# Patient Record
Sex: Female | Born: 1960
Health system: Southern US, Community
[De-identification: ages and names within clinical notes are randomized; demographics above are authoritative.]

## PROBLEM LIST (undated history)

## (undated) DIAGNOSIS — J45909 Unspecified asthma, uncomplicated: Secondary | ICD-10-CM

## (undated) DIAGNOSIS — E785 Hyperlipidemia, unspecified: Secondary | ICD-10-CM

## (undated) DIAGNOSIS — C801 Malignant (primary) neoplasm, unspecified: Secondary | ICD-10-CM

## (undated) DIAGNOSIS — T7840XA Allergy, unspecified, initial encounter: Secondary | ICD-10-CM

## (undated) HISTORY — DX: Unspecified asthma, uncomplicated: J45.909

## (undated) HISTORY — PX: APPENDECTOMY: SHX54

## (undated) HISTORY — DX: Hyperlipidemia, unspecified: E78.5

## (undated) HISTORY — DX: Allergy, unspecified, initial encounter: T78.40XA

## (undated) HISTORY — DX: Malignant (primary) neoplasm, unspecified: C80.1

---

## 2005-08-31 ENCOUNTER — Encounter: Admission: RE | Admit: 2005-08-31 | Discharge: 2005-08-31 | Payer: Self-pay | Admitting: Family Medicine

## 2007-12-19 ENCOUNTER — Encounter: Admission: RE | Admit: 2007-12-19 | Discharge: 2007-12-19 | Payer: Self-pay | Admitting: Dermatology

## 2008-02-15 ENCOUNTER — Encounter: Admission: RE | Admit: 2008-02-15 | Discharge: 2008-02-15 | Payer: Self-pay | Admitting: Family Medicine

## 2011-07-06 ENCOUNTER — Other Ambulatory Visit: Payer: Self-pay | Admitting: Family Medicine

## 2011-07-06 DIAGNOSIS — Z1231 Encounter for screening mammogram for malignant neoplasm of breast: Secondary | ICD-10-CM

## 2011-08-05 ENCOUNTER — Ambulatory Visit
Admission: RE | Admit: 2011-08-05 | Discharge: 2011-08-05 | Disposition: A | Discharge status: Home / Self Care | Payer: BC Managed Care – PPO | Source: Ambulatory Visit | Attending: Family Medicine | Admitting: Family Medicine

## 2011-08-05 DIAGNOSIS — Z1231 Encounter for screening mammogram for malignant neoplasm of breast: Secondary | ICD-10-CM

## 2015-06-06 ENCOUNTER — Encounter: Payer: Self-pay | Admitting: Family Medicine

## 2015-06-06 DIAGNOSIS — E785 Hyperlipidemia, unspecified: Secondary | ICD-10-CM | POA: Insufficient documentation

## 2015-06-06 DIAGNOSIS — Z85828 Personal history of other malignant neoplasm of skin: Secondary | ICD-10-CM | POA: Insufficient documentation

## 2015-06-06 DIAGNOSIS — J45909 Unspecified asthma, uncomplicated: Secondary | ICD-10-CM | POA: Insufficient documentation

## 2015-06-06 DIAGNOSIS — T7840XA Allergy, unspecified, initial encounter: Secondary | ICD-10-CM | POA: Insufficient documentation

## 2015-06-25 ENCOUNTER — Encounter: Payer: Self-pay | Admitting: Family Medicine

## 2015-06-25 ENCOUNTER — Ambulatory Visit (INDEPENDENT_AMBULATORY_CARE_PROVIDER_SITE_OTHER): Payer: 59 | Admitting: Family Medicine

## 2015-06-25 VITALS — BP 120/64 | HR 68 | Temp 97.9°F | Resp 12 | Ht 65.5 in | Wt 163.0 lb

## 2015-06-25 DIAGNOSIS — E785 Hyperlipidemia, unspecified: Secondary | ICD-10-CM

## 2015-06-25 DIAGNOSIS — J4520 Mild intermittent asthma, uncomplicated: Secondary | ICD-10-CM

## 2015-06-25 DIAGNOSIS — Z85828 Personal history of other malignant neoplasm of skin: Secondary | ICD-10-CM

## 2015-06-25 DIAGNOSIS — Z0000 Encounter for general adult medical examination without abnormal findings: Secondary | ICD-10-CM | POA: Diagnosis not present

## 2015-06-25 DIAGNOSIS — Z1239 Encounter for other screening for malignant neoplasm of breast: Secondary | ICD-10-CM | POA: Diagnosis not present

## 2015-06-25 LAB — CBC WITH DIFFERENTIAL/PLATELET
Basophils Absolute: 0.1 10*3/uL (ref 0.0–0.1)
Basophils Relative: 1 % (ref 0–1)
Eosinophils Absolute: 0.2 10*3/uL (ref 0.0–0.7)
Eosinophils Relative: 4 % (ref 0–5)
HEMATOCRIT: 44 % (ref 36.0–46.0)
HEMOGLOBIN: 15 g/dL (ref 12.0–15.0)
LYMPHS ABS: 1.4 10*3/uL (ref 0.7–4.0)
LYMPHS PCT: 26 % (ref 12–46)
MCH: 29.7 pg (ref 26.0–34.0)
MCHC: 34.1 g/dL (ref 30.0–36.0)
MCV: 87.1 fL (ref 78.0–100.0)
MONOS PCT: 10 % (ref 3–12)
MPV: 10.4 fL (ref 8.6–12.4)
Monocytes Absolute: 0.5 10*3/uL (ref 0.1–1.0)
NEUTROS ABS: 3.1 10*3/uL (ref 1.7–7.7)
NEUTROS PCT: 59 % (ref 43–77)
Platelets: 230 10*3/uL (ref 150–400)
RBC: 5.05 MIL/uL (ref 3.87–5.11)
RDW: 12.9 % (ref 11.5–15.5)
WBC: 5.2 10*3/uL (ref 4.0–10.5)

## 2015-06-25 LAB — COMPREHENSIVE METABOLIC PANEL
ALBUMIN: 4.3 g/dL (ref 3.6–5.1)
ALK PHOS: 95 U/L (ref 33–130)
ALT: 12 U/L (ref 6–29)
AST: 17 U/L (ref 10–35)
BUN: 17 mg/dL (ref 7–25)
CHLORIDE: 101 mmol/L (ref 98–110)
CO2: 27 mmol/L (ref 20–31)
Calcium: 9.2 mg/dL (ref 8.6–10.4)
Creat: 0.85 mg/dL (ref 0.50–1.05)
Glucose, Bld: 85 mg/dL (ref 70–99)
POTASSIUM: 4.4 mmol/L (ref 3.5–5.3)
Sodium: 136 mmol/L (ref 135–146)
TOTAL PROTEIN: 7 g/dL (ref 6.1–8.1)
Total Bilirubin: 0.8 mg/dL (ref 0.2–1.2)

## 2015-06-25 LAB — LIPID PANEL
CHOL/HDL RATIO: 6.1 ratio — AB (ref <=5.0)
CHOLESTEROL: 276 mg/dL — AB (ref 125–200)
HDL: 45 mg/dL — ABNORMAL LOW (ref >=46)
LDL Cholesterol: 181 mg/dL — ABNORMAL HIGH (ref <130)
TRIGLYCERIDES: 250 mg/dL — AB (ref <150)
VLDL: 50 mg/dL — AB (ref <30)

## 2015-06-25 LAB — TSH: TSH: 2.684 u[IU]/mL (ref 0.350–4.500)

## 2015-06-25 NOTE — Patient Instructions (Addendum)
Release of records - Dr. Jan Fireman -- [place] of records- [facility] Schedule Mammogram Stool cards for colon cancer screening We will call for lab results Referral Dermatology  F/U schedule a PAP Smear within the next 2 months

## 2015-06-25 NOTE — Progress Notes (Signed)
Patient ID: Paula Beasley, female   DOB: 09/23/1960, 54 y.o.   MRN: [id number]   Subjective:    Patient ID: Paula Beasley, female    DOB: 1961/06/14, 54 y.o.   MRN: [id number]  Patient presents for [facility] and Referral  issue here to establish care for new patient physical. She is presented being followed by Dr. Jabier Mutton in [place]. Her husband is a Theme park manager and they move around quite a lot so she is only with the doctor typically 2-3 years. When she was last here in in [place] she was seen [facility]. This  She has history of intermittent allergy and asthma she uses duo lira only when she needs to she does not feel like the albuterol helps her at all. Occasionally she will take antihistamines. She does not like taking medications on a regular basis and also avoids most of the immunizations.  She does have family history of breast cancer in her mother and she is overdue for mammogram.  She has no family history of colon cancer and has declined getting colonoscopy in the past she wants to know if there is another alternative to the actual screening colonoscopy.  She is history of some purulent bowel problems typically she gets a nervous stomach while she is traveling so she has Lomotil on hand.  In the past she was treated with Effexor for hot flashes she is no longer on this medication.  History of squamous cell carcinoma as well as basal cell carcinoma she's had multiple lesions and needs to establish with a dermatologist here for her yearly routine checks. No moles of concern at this time    Review Of Systems:  GEN- denies fatigue, fever, weight loss,weakness, recent illness HEENT- denies eye drainage, change in vision, nasal discharge, CVS- denies chest pain, palpitations RESP- denies SOB, cough, wheeze ABD- denies N/V, change in stools, abd pain GU- denies dysuria, hematuria, dribbling, incontinence MSK- denies joint pain, muscle aches, injury Neuro-  denies headache, dizziness, syncope, seizure activity       Objective:    BP 120/64 mmHg  Pulse 68  Temp(Src) 97.9 F (36.6 C) (Oral)  Resp 12  Ht 5' 5.5" (1.664 m)  Wt 163 lb (73.936 kg)  BMI 26.70 kg/m2 GEN- NAD, alert and oriented x3 HEENT- PERRL, EOMI, non injected sclera, pink conjunctiva, MMM, oropharynx clear Neck- Supple, no thyromegaly CVS- RRR, no murmur RESP-CTAB ABD-NABS,soft,NT,ND GU- deferred Psych- normal affect and mood  EXT- No edema Pulses- Radial, DP- 2+        Assessment & Plan:      Problem List Items Addressed This Visit    Hyperlipidemia - Primary    Check lipids, family history of CAD and hyperlipidemia      Relevant Orders   Lipid panel   History of SCC (squamous cell carcinoma) of skin    Refer to dermatology, needs yearly exams, multiple SCC and BCC      Relevant Orders   Ambulatory referral to Dermatology   Asthma    Continue Dulera as needed, does not feel albuterol helps, advised in emergency or flare to come in for treatment      Relevant Medications   mometasone-formoterol (DULERA) 100-5 MCG/ACT AERO    Other Visit Diagnoses    Routine general medical examination at a health care facility        CPE done, stool cards for colon screening, pt to schedule mammogram, obtain records for TDAP, declines flu shot\ Deferred PAP  until next visit, at pt request     Relevant Orders    CBC with Differential/Platelet    Comprehensive metabolic panel    TSH    Breast cancer screening        Relevant Orders    MM DIGITAL SCREENING BILATERAL       Note: This dictation was prepared with Dragon dictation along with smaller phrase technology. Any transcriptional errors that result from this process are unintentional.

## 2015-06-25 NOTE — Assessment & Plan Note (Signed)
Continue Dulera as needed, does not feel albuterol helps, advised in emergency or flare to come in for treatment

## 2015-06-25 NOTE — Assessment & Plan Note (Signed)
Check lipids, family history of CAD and hyperlipidemia

## 2015-06-25 NOTE — Assessment & Plan Note (Signed)
Refer to dermatology, needs yearly exams, multiple SCC and BCC

## 2015-06-30 ENCOUNTER — Encounter: Payer: Self-pay | Admitting: *Deleted

## 2015-06-30 ENCOUNTER — Other Ambulatory Visit: Payer: Self-pay | Admitting: *Deleted

## 2015-06-30 DIAGNOSIS — E785 Hyperlipidemia, unspecified: Secondary | ICD-10-CM

## 2015-06-30 MED ORDER — ATORVASTATIN CALCIUM 20 MG PO TABS: 20.0000 mg | ORAL_TABLET | Freq: Every day | ORAL | Status: DC

## 2015-07-16 ENCOUNTER — Other Ambulatory Visit: Payer: Self-pay | Admitting: Family Medicine

## 2015-07-16 DIAGNOSIS — Z1231 Encounter for screening mammogram for malignant neoplasm of breast: Secondary | ICD-10-CM

## 2015-07-18 ENCOUNTER — Other Ambulatory Visit: Payer: 59

## 2015-07-18 DIAGNOSIS — Z1212 Encounter for screening for malignant neoplasm of rectum: Principal | ICD-10-CM

## 2015-07-18 DIAGNOSIS — Z1211 Encounter for screening for malignant neoplasm of colon: Secondary | ICD-10-CM

## 2015-07-19 LAB — FECAL OCCULT BLOOD, IMMUNOCHEMICAL
FECAL OCCULT BLOOD: NEGATIVE
FECAL OCCULT BLOOD: NEGATIVE

## 2015-07-21 ENCOUNTER — Telehealth: Payer: Self-pay | Admitting: *Deleted

## 2015-07-21 ENCOUNTER — Encounter: Payer: Self-pay | Admitting: *Deleted

## 2015-07-21 NOTE — Telephone Encounter (Signed)
Submitted referral thru [facility] on 11/101/16 to Dr. Druscilla Brownie [facility] with Referral number [id number]  Type of referral: consult and treat  Number of visits:6  Start date: 07/17/15  End date: 01/14/16  Dx: Z85.828- personal history of other malignany neoplasm of skin

## 2015-07-25 LAB — FECAL OCCULT BLOOD, IMMUNOCHEMICAL: Fecal Occult Blood: NEGATIVE

## 2015-08-04 ENCOUNTER — Other Ambulatory Visit: Payer: Self-pay | Admitting: Family Medicine

## 2015-08-04 NOTE — Telephone Encounter (Signed)
Refill appropriate and filled per protocol. 

## 2015-08-06 ENCOUNTER — Ambulatory Visit
Admission: RE | Admit: 2015-08-06 | Discharge: 2015-08-06 | Disposition: A | Discharge status: Home / Self Care | Payer: 59 | Source: Ambulatory Visit | Attending: Family Medicine | Admitting: Family Medicine

## 2015-08-06 DIAGNOSIS — Z1231 Encounter for screening mammogram for malignant neoplasm of breast: Secondary | ICD-10-CM

## 2015-08-12 ENCOUNTER — Other Ambulatory Visit: Payer: 59 | Admitting: Family Medicine

## 2015-10-02 ENCOUNTER — Other Ambulatory Visit: Payer: Self-pay | Admitting: Family Medicine

## 2015-10-02 NOTE — Telephone Encounter (Signed)
Refill appropriate and filled per protocol. 

## 2016-05-03 ENCOUNTER — Encounter: Payer: Self-pay | Admitting: Physician Assistant

## 2016-05-03 ENCOUNTER — Ambulatory Visit (INDEPENDENT_AMBULATORY_CARE_PROVIDER_SITE_OTHER): Payer: BLUE CROSS/BLUE SHIELD | Admitting: Physician Assistant

## 2016-05-03 VITALS — BP 122/84 | HR 72 | Resp 16 | Wt 160.0 lb

## 2016-05-03 DIAGNOSIS — B9689 Other specified bacterial agents as the cause of diseases classified elsewhere: Principal | ICD-10-CM

## 2016-05-03 DIAGNOSIS — J0191 Acute recurrent sinusitis, unspecified: Secondary | ICD-10-CM | POA: Diagnosis not present

## 2016-05-03 DIAGNOSIS — J988 Other specified respiratory disorders: Secondary | ICD-10-CM | POA: Diagnosis not present

## 2016-05-03 MED ORDER — AZITHROMYCIN 250 MG PO TABS: ORAL_TABLET | ORAL | 0 refills | Status: DC

## 2016-05-03 MED ORDER — PREDNISONE 20 MG PO TABS: ORAL_TABLET | ORAL | 0 refills | Status: DC

## 2016-05-03 NOTE — Progress Notes (Signed)
    Patient ID: Paula Beasley MRN: [id number], DOB: October 10, 1960, 55 y.o. Date of Encounter: 05/03/2016, 10:18 AM    Chief Complaint:  Chief Complaint  Patient presents with  . Sinus Problem    11 days     HPI: 55 y.o. year old white female presents with above.   Says that she usually has sinus infection once a year.  Says that she has been doing everything she can to treat this herself but now it has been 11 days and not improving.  Has been using Zyrtec, Mucinex and decongestant.  Feels very stopped up. Says it is all just in her head and nose and sinuses. Nothing in her chest. No sore throat or earache. No fevers or chills.     Home Meds:   Outpatient Medications Prior to Visit  Medication Sig Dispense Refill  . atorvastatin (LIPITOR) 20 MG tablet TAKE 1 TABLET BY MOUTH EVERY DAY (Patient not taking: Reported on 05/03/2016) 90 tablet 1  . diphenoxylate-atropine (LOMOTIL) 2.5-0.025 MG tablet Take 1 tablet by mouth 4 (four) times daily as needed for diarrhea or loose stools.    . mometasone-formoterol (DULERA) 100-5 MCG/ACT AERO Inhale 2 puffs into the lungs 2 (two) times daily.     No facility-administered medications prior to visit.     Allergies:  Allergies  Allergen Reactions  . Penicillins Rash      Review of Systems: See HPI for pertinent ROS. All other ROS negative.    Physical Exam: Blood pressure 122/84, pulse 72, resp. rate 16, weight 160 lb (72.6 kg)., Body mass index is 26.22 kg/m. General:  WNWD WF. Appears in no acute distress. HEENT: Normocephalic, atraumatic, eyes without discharge, sclera non-icteric, nares are without discharge. Bilateral auditory canals clear, TM's are without perforation, pearly grey and translucent with reflective cone of light bilaterally. Oral cavity moist, posterior pharynx without exudate, erythema, peritonsillar abscess. Mild tenderness with percussion to frontal and maxillary sinuses bilaterally.  Neck: Supple. No thyromegaly. No  lymphadenopathy. Lungs: Clear bilaterally to auscultation without wheezes, rales, or rhonchi. Breathing is unlabored. Heart: Regular rhythm. No murmurs, rubs, or gallops. Msk:  Strength and tone normal for age. Extremities/Skin: Warm and dry.  Neuro: Alert and oriented X 3. Moves all extremities spontaneously. Gait is normal. CNII-XII grossly in tact. Psych:  Responds to questions appropriately with a normal affect.     ASSESSMENT AND PLAN:  55 y.o. year old female with  1. Bacterial respiratory infection  She has penicillin allergy. Therefore will use azithromycin. She is to take the azithromycin and the prednisone as directed.  Can also continue over-the-counter decongestants. F/U if symptoms are not resolved within 1 week after completion of antibiotic. - azithromycin (ZITHROMAX) 250 MG tablet; Day 1: Take 2 daily. Days 2-5: Take 1 daily.  Dispense: 6 tablet; Refill: 0  2. Acute recurrent sinusitis, unspecified location - azithromycin (ZITHROMAX) 250 MG tablet; Day 1: Take 2 daily. Days 2-5: Take 1 daily.  Dispense: 6 tablet; Refill: 0 - predniSONE (DELTASONE) 20 MG tablet; Take 3 daily for 2 days, then 2 daily for 2 days, then 1 daily for 2 days.  Dispense: 12 tablet; Refill: 0   Signed, [address] [place], [place], [facility] 05/03/2016 10:18 AM

## 2016-05-17 ENCOUNTER — Encounter: Payer: Self-pay | Admitting: Family Medicine

## 2016-05-17 ENCOUNTER — Ambulatory Visit (INDEPENDENT_AMBULATORY_CARE_PROVIDER_SITE_OTHER): Payer: BLUE CROSS/BLUE SHIELD | Admitting: Family Medicine

## 2016-05-17 VITALS — BP 122/68 | HR 74 | Temp 98.9°F | Resp 14 | Ht 65.5 in | Wt 158.0 lb

## 2016-05-17 DIAGNOSIS — J0191 Acute recurrent sinusitis, unspecified: Secondary | ICD-10-CM

## 2016-05-17 DIAGNOSIS — L509 Urticaria, unspecified: Secondary | ICD-10-CM

## 2016-05-17 LAB — CBC WITH DIFFERENTIAL/PLATELET
BASOS ABS: 0 {cells}/uL (ref 0–200)
Basophils Relative: 0 %
EOS PCT: 1 %
Eosinophils Absolute: 128 cells/uL (ref 15–500)
HEMATOCRIT: 46 % — AB (ref 35.0–45.0)
HEMOGLOBIN: 16 g/dL — AB (ref 12.0–15.0)
LYMPHS PCT: 11 %
Lymphs Abs: 1408 cells/uL (ref 850–3900)
MCH: 29.9 pg (ref 27.0–33.0)
MCHC: 34.8 g/dL (ref 32.0–36.0)
MCV: 86 fL (ref 80.0–100.0)
MONO ABS: 640 {cells}/uL (ref 200–950)
MPV: 11 fL (ref 7.5–12.5)
Monocytes Relative: 5 %
NEUTROS PCT: 83 %
Neutro Abs: 10624 cells/uL — ABNORMAL HIGH (ref 1500–7800)
Platelets: 243 10*3/uL (ref 140–400)
RBC: 5.35 MIL/uL — AB (ref 3.80–5.10)
RDW: 12.8 % (ref 11.0–15.0)
WBC: 12.8 10*3/uL — AB (ref 3.8–10.8)

## 2016-05-17 LAB — COMPREHENSIVE METABOLIC PANEL
ALBUMIN: 4.2 g/dL (ref 3.6–5.1)
ALT: 13 U/L (ref 6–29)
AST: 16 U/L (ref 10–35)
Alkaline Phosphatase: 70 U/L (ref 33–130)
BILIRUBIN TOTAL: 1.7 mg/dL — AB (ref 0.2–1.2)
BUN: 18 mg/dL (ref 7–25)
CALCIUM: 9.3 mg/dL (ref 8.6–10.4)
CHLORIDE: 102 mmol/L (ref 98–110)
CO2: 23 mmol/L (ref 20–31)
Creat: 0.99 mg/dL (ref 0.50–1.05)
GLUCOSE: 136 mg/dL — AB (ref 70–99)
Potassium: 4.5 mmol/L (ref 3.5–5.3)
Sodium: 136 mmol/L (ref 135–146)
Total Protein: 6.6 g/dL (ref 6.1–8.1)

## 2016-05-17 MED ORDER — PREDNISONE 20 MG PO TABS: ORAL_TABLET | ORAL | 0 refills | Status: DC

## 2016-05-17 MED ORDER — HYDROXYZINE HCL 10 MG PO TABS: 10.0000 mg | ORAL_TABLET | Freq: Three times a day (TID) | ORAL | 0 refills | Status: DC | PRN

## 2016-05-17 MED ORDER — METHYLPREDNISOLONE ACETATE 80 MG/ML IJ SUSP
80.0000 mg | Freq: Once | INTRAMUSCULAR | Status: AC
  Administered 2016-05-17: 80 mg via INTRAMUSCULAR

## 2016-05-17 NOTE — Progress Notes (Signed)
   Subjective:    Patient ID: Paula Beasley, female    DOB: October 17, 1960, 55 y.o.   MRN: [id number]  Patient presents for Urticaria (x2 days- generalized rash to torso, arms, legs, face- slight fever)  Patient here with hives for the past 2 days. She states that she was an argument with a family member and was very on edge chapter that she has some itching on her abdomen and her upper back. Over the next 48 hours she broke out in extensive hives which is now to her facial area. She denies any difficulty breathing no cough no tongue or lip swelling. She took maximum doses of Benadryl but this does not help very much. She is not Hardin Negus that any longer with regards to the conversation she had with her family member but just does not feel right. She denies any recent illness. No new foods no new medications. With the exception of azithromycin which she took about 2 weeks ago for sinustis  but she's had this multiple times in the past that she has allergy to penicillin   Review Of Systems:  GEN- denies fatigue, fever, weight loss,weakness, recent illness HEENT- denies eye drainage, change in vision, nasal discharge, CVS- denies chest pain, palpitations RESP- denies SOB, cough, wheeze ABD- denies N/V, change in stools, abd pain GU- denies dysuria, hematuria, dribbling, incontinence MSK- denies joint pain, muscle aches, injury Neuro- denies headache, dizziness, syncope, seizure activity       Objective:    BP 122/68 (BP Location: Left Arm, Patient Position: Sitting, Cuff Size: Normal)   Pulse 74   Temp 98.9 F (37.2 C) (Oral)   Resp 14   Ht 5' 5.5" (1.664 m)   Wt 158 lb (71.7 kg)   BMI 25.89 kg/m  GEN- NAD, alert and oriented x3 HEENT- PERRL, EOMI, non injected sclera, pink conjunctiva, MMM, oropharynx clear Neck- Supple, no LAD  CVS- RRR, no murmur RESP-CTAB Skin-extensive hives (blaching circular lesions)  on trunk, arms, legs that Coalesce on chest, area above right eye  EXT- No  edema Pulses- Radial  2+        Assessment & Plan:      Problem List Items Addressed This Visit    None    Visit Diagnoses    Urticaria    -  Primary   unknown cause, ? anxiety related but quite extensive. Will treat wtih depo medrol , prednisone taper and atarax,check labs- eosinophils   Relevant Medications   methylPREDNISolone acetate (DEPO-MEDROL) injection 80 mg (Completed)   Other Relevant Orders   CBC with Differential/Platelet   Comprehensive metabolic panel   Acute recurrent sinusitis, unspecified location       Relevant Medications   diphenhydrAMINE (BENADRYL) 25 MG tablet   cetirizine (ZYRTEC) 10 MG tablet   predniSONE (DELTASONE) 20 MG tablet   methylPREDNISolone acetate (DEPO-MEDROL) injection 80 mg (Completed)      Note: This dictation was prepared with Dragon dictation along with smaller phrase technology. Any transcriptional errors that result from this process are unintentional.

## 2016-05-17 NOTE — Patient Instructions (Addendum)
F/U  If not improved  Steroid injection 48mIM  Hydroxyzine 160mthree times a day as needed   Hives Hives are itchy, red, swollen areas of the skin. They can vary in size and location on your body. Hives can come and go for hours or several days (acute hives) or for several weeks (chronic hives). Hives do not spread from person to person (noncontagious). They may get worse with scratching, exercise, and emotional stress. CAUSES   Allergic reaction to food, additives, or drugs.  Infections, including the common cold.  Illness, such as vasculitis, lupus, or thyroid disease.  Exposure to sunlight, heat, or cold.  Exercise.  Stress.  Contact with chemicals. SYMPTOMS   Red or white swollen patches on the skin. The patches may change size, shape, and location quickly and repeatedly.  Itching.  Swelling of the hands, feet, and face. This may occur if hives develop deeper in the skin. DIAGNOSIS  Your caregiver can usually tell what is wrong by performing a physical exam. Skin or blood tests may also be done to determine the cause of your hives. In some cases, the cause cannot be determined. TREATMENT  Mild cases usually get better with medicines such as antihistamines. Severe cases may require an emergency epinephrine injection. If the cause of your hives is known, treatment includes avoiding that trigger.  HOME CARE INSTRUCTIONS   Avoid causes that trigger your hives.  Take antihistamines as directed by your caregiver to reduce the severity of your hives. Non-sedating or low-sedating antihistamines are usually recommended. Do not drive while taking an antihistamine.  Take any other medicines prescribed for itching as directed by your caregiver.  Wear loose-fitting clothing.  Keep all follow-up appointments as directed by your caregiver. SEEK MEDICAL CARE IF:   You have persistent or severe itching that is not relieved with medicine.  You have painful or swollen joints. SEEK  IMMEDIATE MEDICAL CARE IF:   You have a fever.  Your tongue or lips are swollen.  You have trouble breathing or swallowing.  You feel tightness in the throat or chest.  You have abdominal pain. These problems may be the first sign of a life-threatening allergic reaction. Call your local emergency services (911 in [place] MAKE SURE YOU:   Understand these instructions.  Will watch your condition.  Will get help right away if you are not doing well or get worse.   This information is not intended to replace advice given to you by your health care provider. Make sure you discuss any questions you have with your health care provider.   Document Released: 08/23/2005 Document Revised: 08/28/2013 Document Reviewed: 11/16/2011 Elsevier Interactive Patient Education 20[employer]

## 2016-05-18 ENCOUNTER — Telehealth: Payer: Self-pay

## 2016-05-18 NOTE — Telephone Encounter (Signed)
-----   Message from Alycia Rossetti, MD sent at 05/18/2016  2:11 PM EDT ----- Call pt see how her Hives are? They did improve with steroids, atarax,any new symptoms? Her WBC were elevated showing inflammed state, still unknown cause If she is not any better, then have her come in tomorrow for a recheck

## 2016-05-18 NOTE — Telephone Encounter (Signed)
Pt states she is feeling better still has hives, but it has improved a lot.

## 2016-06-02 ENCOUNTER — Ambulatory Visit: Payer: BLUE CROSS/BLUE SHIELD | Admitting: Family Medicine

## 2016-07-23 ENCOUNTER — Other Ambulatory Visit: Payer: Self-pay | Admitting: Family Medicine

## 2016-07-23 NOTE — Telephone Encounter (Signed)
Ok to refill 

## 2016-07-23 NOTE — Telephone Encounter (Signed)
Medication called to pharmacy. 

## 2016-07-23 NOTE — Telephone Encounter (Signed)
okay

## 2017-04-12 DIAGNOSIS — H02839 Dermatochalasis of unspecified eye, unspecified eyelid: Secondary | ICD-10-CM | POA: Diagnosis not present

## 2017-04-12 DIAGNOSIS — H2513 Age-related nuclear cataract, bilateral: Secondary | ICD-10-CM | POA: Diagnosis not present

## 2017-04-12 DIAGNOSIS — H25043 Posterior subcapsular polar age-related cataract, bilateral: Secondary | ICD-10-CM | POA: Diagnosis not present

## 2017-04-12 DIAGNOSIS — H2511 Age-related nuclear cataract, right eye: Secondary | ICD-10-CM | POA: Diagnosis not present

## 2017-04-12 DIAGNOSIS — H25013 Cortical age-related cataract, bilateral: Secondary | ICD-10-CM | POA: Diagnosis not present

## 2017-05-13 ENCOUNTER — Other Ambulatory Visit: Payer: Self-pay

## 2017-05-13 ENCOUNTER — Other Ambulatory Visit: Payer: Self-pay | Admitting: Family Medicine

## 2017-05-13 MED ORDER — DIPHENOXYLATE-ATROPINE 2.5-0.025 MG PO TABS: ORAL_TABLET | ORAL | 0 refills | Status: DC

## 2017-05-23 ENCOUNTER — Telehealth: Payer: Self-pay

## 2017-05-23 MED ORDER — DIPHENOXYLATE-ATROPINE 2.5-0.025 MG PO TABS: ORAL_TABLET | ORAL | 0 refills | Status: DC

## 2017-05-23 NOTE — Telephone Encounter (Signed)
Medication called to pharmacy. 

## 2017-05-23 NOTE — Telephone Encounter (Signed)
Okay to refill Lomotil

## 2017-05-23 NOTE — Telephone Encounter (Signed)
Received VM from patient.   Requested refill on Lomotil.   Ok to refill?

## 2017-05-23 NOTE — Telephone Encounter (Signed)
Call placed to patient and patient made aware per VM.  

## 2017-05-30 DIAGNOSIS — H2511 Age-related nuclear cataract, right eye: Secondary | ICD-10-CM | POA: Diagnosis not present

## 2017-05-30 DIAGNOSIS — H52201 Unspecified astigmatism, right eye: Secondary | ICD-10-CM | POA: Diagnosis not present

## 2017-05-30 DIAGNOSIS — H2513 Age-related nuclear cataract, bilateral: Secondary | ICD-10-CM | POA: Diagnosis not present

## 2017-05-31 DIAGNOSIS — H2512 Age-related nuclear cataract, left eye: Secondary | ICD-10-CM | POA: Diagnosis not present

## 2017-08-13 DIAGNOSIS — Z961 Presence of intraocular lens: Secondary | ICD-10-CM | POA: Diagnosis not present

## 2017-08-13 DIAGNOSIS — H43811 Vitreous degeneration, right eye: Secondary | ICD-10-CM | POA: Diagnosis not present

## 2017-08-13 DIAGNOSIS — H2512 Age-related nuclear cataract, left eye: Secondary | ICD-10-CM | POA: Diagnosis not present

## 2017-09-14 ENCOUNTER — Other Ambulatory Visit: Payer: Self-pay | Admitting: Family Medicine

## 2017-09-14 DIAGNOSIS — Z139 Encounter for screening, unspecified: Secondary | ICD-10-CM

## 2017-09-21 ENCOUNTER — Encounter: Payer: Self-pay | Admitting: Family Medicine

## 2017-09-21 ENCOUNTER — Other Ambulatory Visit: Payer: Self-pay

## 2017-09-21 ENCOUNTER — Ambulatory Visit: Payer: BLUE CROSS/BLUE SHIELD | Admitting: Family Medicine

## 2017-09-21 VITALS — BP 124/64 | HR 88 | Temp 98.8°F | Resp 14 | Ht 65.5 in | Wt 156.0 lb

## 2017-09-21 DIAGNOSIS — R197 Diarrhea, unspecified: Secondary | ICD-10-CM | POA: Diagnosis not present

## 2017-09-21 DIAGNOSIS — K529 Noninfective gastroenteritis and colitis, unspecified: Secondary | ICD-10-CM

## 2017-09-21 LAB — CBC WITH DIFFERENTIAL/PLATELET
BASOS PCT: 0.6 %
Basophils Absolute: 43 cells/uL (ref 0–200)
EOS PCT: 1.8 %
Eosinophils Absolute: 128 cells/uL (ref 15–500)
HCT: 42 % (ref 35.0–45.0)
Hemoglobin: 14.7 g/dL (ref 11.7–15.5)
Lymphs Abs: 1328 cells/uL (ref 850–3900)
MCH: 29 pg (ref 27.0–33.0)
MCHC: 35 g/dL (ref 32.0–36.0)
MCV: 82.8 fL (ref 80.0–100.0)
MONOS PCT: 8.8 %
MPV: 10.5 fL (ref 7.5–12.5)
Neutro Abs: 4977 cells/uL (ref 1500–7800)
Neutrophils Relative %: 70.1 %
PLATELETS: 266 10*3/uL (ref 140–400)
RBC: 5.07 10*6/uL (ref 3.80–5.10)
RDW: 11.9 % (ref 11.0–15.0)
Total Lymphocyte: 18.7 %
WBC: 7.1 10*3/uL (ref 3.8–10.8)
WBCMIX: 625 {cells}/uL (ref 200–950)

## 2017-09-21 LAB — COMPREHENSIVE METABOLIC PANEL
AG Ratio: 1.9 (calc) (ref 1.0–2.5)
ALBUMIN MSPROF: 4.4 g/dL (ref 3.6–5.1)
ALT: 15 U/L (ref 6–29)
AST: 16 U/L (ref 10–35)
Alkaline phosphatase (APISO): 88 U/L (ref 33–130)
BUN: 22 mg/dL (ref 7–25)
CO2: 27 mmol/L (ref 20–32)
CREATININE: 0.93 mg/dL (ref 0.50–1.05)
Calcium: 9.3 mg/dL (ref 8.6–10.4)
Chloride: 104 mmol/L (ref 98–110)
GLOBULIN: 2.3 g/dL (ref 1.9–3.7)
GLUCOSE: 104 mg/dL — AB (ref 65–99)
POTASSIUM: 4.3 mmol/L (ref 3.5–5.3)
SODIUM: 138 mmol/L (ref 135–146)
TOTAL PROTEIN: 6.7 g/dL (ref 6.1–8.1)
Total Bilirubin: 0.5 mg/dL (ref 0.2–1.2)

## 2017-09-21 LAB — LIPASE: Lipase: 12 U/L (ref 7–60)

## 2017-09-21 MED ORDER — MOMETASONE FURO-FORMOTEROL FUM 100-5 MCG/ACT IN AERO: 2.0000 | INHALATION_SPRAY | Freq: Two times a day (BID) | RESPIRATORY_TRACT | 2 refills | Status: DC

## 2017-09-21 NOTE — Progress Notes (Signed)
   Subjective:    Patient ID: Paula Beasley, female    DOB: Aug 20, 1961, 57 y.o.   MRN: [id number]  Patient presents for GI Issues (x2 weeks- abd cramping, loose stools, states that imodium will relieve sx momentarily, but they come back)  Patient here with diarrhea for the past 2 weeks.  Denies any illness preceding that.  No new medications.  She has not had any change in her diet.  She does have a family history of diverticulitis.  She did start taking Imodium and the slow down her diarrhea but then she would have another loose bowel movement the next day.  She has not had any vomiting or nausea.  She is eating normally.  She just feels very fatigued and kind of worn out for the past 2 weeks as well.  No cough or congestion no fever no chills.  No known sick contacts with anyone with diarrhea.  She has not traveled outside of the country.  She has not noticed any blood in the stool.  No UTI symptoms.   Review Of Systems:  GEN- denies fatigue, fever, weight loss,weakness, recent illness HEENT- denies eye drainage, change in vision, nasal discharge, CVS- denies chest pain, palpitations RESP- denies SOB, cough, wheeze ABD- denies N/V, +change in stools, +abd pain GU- denies dysuria, hematuria, dribbling, incontinence MSK- denies joint pain, muscle aches, injury Neuro- denies headache, dizziness, syncope, seizure activity       Objective:    BP 124/64   Pulse 88   Temp 98.8 F (37.1 C) (Oral)   Resp 14   Ht 5' 5.5" (1.664 m)   Wt 156 lb (70.8 kg)   SpO2 100%   BMI 25.56 kg/m  GEN- NAD, alert and oriented x3 HEENT- PERRL, EOMI, non injected sclera, pink conjunctiva, MMM, oropharynx clear Neck- Supple, no LAD CVS- RRR, no murmur RESP-CTAB ABD-BS all 4 quadrants a little hypoactive ,soft,mild diffiuse TTP, ND EXT- No edema Pulses- Radial  2+        Assessment & Plan:      Problem List Items Addressed This Visit    None    Visit Diagnoses    Diarrhea, unspecified type     -  Primary   Stat labs normal, including lipase, no fever, no vomiting, possible gastroenteritis and as she has some IBS underlying may be prolonged, other differentials colitis or diverticulitis No bloody stools, normal CBC. Diarrhea is improved with immodium so will have her take Lomotil which she has on hand for IBS when she travels for next 2-3 days, if pain worsens or diarrhea breaks through need CT scan Did offer earlier scan but due to finances she prefers to hold off and see how she does, since vitals stable, no weight loss, I think that is reasonable   Relevant Orders   CBC with Differential/Platelet (Completed)   Comprehensive metabolic panel (Completed)   Lipase (Completed)   Gastroenteritis       Relevant Orders   CBC with Differential/Platelet (Completed)   Comprehensive metabolic panel (Completed)   Lipase (Completed)      Note: This dictation was prepared with Dragon dictation along with smaller phrase technology. Any transcriptional errors that result from this process are unintentional.

## 2017-09-21 NOTE — Patient Instructions (Signed)
F/U pending results  Take a lomotil

## 2017-09-26 ENCOUNTER — Telehealth: Payer: Self-pay | Admitting: *Deleted

## 2017-09-26 DIAGNOSIS — R1084 Generalized abdominal pain: Secondary | ICD-10-CM

## 2017-09-26 DIAGNOSIS — R197 Diarrhea, unspecified: Secondary | ICD-10-CM

## 2017-09-26 NOTE — Telephone Encounter (Signed)
Received call from patient.   Patient seen in office on 09/21/2017 D/T diarrhea.   Reports that she is not showing any improvement at this time.   MD please advise.

## 2017-09-26 NOTE — Telephone Encounter (Signed)
Recommend CT abdomen and pelvis with contrast - DX Diarrhea, abdominal pain

## 2017-09-26 NOTE — Telephone Encounter (Signed)
Call placed to patient and patient made aware.   Patient reports that she is improving slightly since her phone call this AM. Inquired as to if she can still cancel if symptoms resolve. Advised that she will be able to cancel scan is symptoms resolve.   Orders placed.

## 2017-09-29 ENCOUNTER — Encounter: Payer: Self-pay | Admitting: Family Medicine

## 2017-09-29 ENCOUNTER — Ambulatory Visit: Payer: BLUE CROSS/BLUE SHIELD | Admitting: Family Medicine

## 2017-09-29 VITALS — BP 112/76 | HR 84 | Temp 98.5°F | Resp 16 | Ht 65.0 in | Wt 152.0 lb

## 2017-09-29 DIAGNOSIS — R197 Diarrhea, unspecified: Secondary | ICD-10-CM | POA: Diagnosis not present

## 2017-09-29 DIAGNOSIS — J069 Acute upper respiratory infection, unspecified: Secondary | ICD-10-CM

## 2017-09-29 MED ORDER — ALBUTEROL SULFATE HFA 108 (90 BASE) MCG/ACT IN AERS: 2.0000 | INHALATION_SPRAY | Freq: Four times a day (QID) | RESPIRATORY_TRACT | 0 refills | Status: DC | PRN

## 2017-09-29 NOTE — Progress Notes (Signed)
Subjective:    Patient ID: Paula Beasley, female    DOB: [birth year], 59 y.o.   MRN: [id number]  HPI Patient is here today because she is concerned about diarrhea. She states that she's had diarrhea now for more than 2 weeks. She states that the diarrhea is basically liquid water that simply falls out of her numerous times a day. She can barely leave the home because of constantly running back and forth to the bathroom. She is going soft and she cannot keep track of the number. She denies any fevers or chills but she does report weight loss. She denies any hematochezia although she does occasionally have blood due to wiping. She has not traveled outside the country. She's not been hospitalized. She has not recently taken any antibiotics. She is not on any chronic medications that would cause diarrhea. She is also concerned about an upper respiratory infection that she has. It has gone on now for about a week. Symptoms include rhinorrhea, cough, head congestion, sore scratchy throat, and fatigue. On exam today she is wheezing faintly on expiration but otherwise her exam is reassuring Past Medical History:  Diagnosis Date  . Allergy   . Asthma   . Cancer ([place])    skin  . Hyperlipidemia    Past Surgical History:  Procedure Laterality Date  . APPENDECTOMY     57yo   Current Outpatient Medications on File Prior to Visit  Medication Sig Dispense Refill  . cetirizine (ZYRTEC) 10 MG tablet Take 10 mg by mouth daily.    . diphenhydrAMINE (BENADRYL) 25 MG tablet Take 25 mg by mouth every 6 (six) hours as needed.    . diphenoxylate-atropine (LOMOTIL) 2.5-0.025 MG tablet TAKE 1 TO 2 TABLETS BY MOUTH FOUR TIMES DAILY AS NEEDED. 20 tablet 0  . mometasone-formoterol (DULERA) 100-5 MCG/ACT AERO Inhale 2 puffs into the lungs 2 (two) times daily. 1 Inhaler 2   No current facility-administered medications on file prior to visit.    Allergies  Allergen Reactions  . Penicillins Rash   Social History    Socioeconomic History  . Marital status: Married    Spouse name: Not on file  . Number of children: Not on file  . Years of education: Not on file  . Highest education level: Not on file  Social Needs  . Financial resource strain: Not on file  . Food insecurity - worry: Not on file  . Food insecurity - inability: Not on file  . Transportation needs - medical: Not on file  . Transportation needs - non-medical: Not on file  Occupational History  . Not on file  Tobacco Use  . Smoking status: Never Smoker  . Smokeless tobacco: Never Used  Substance and Sexual Activity  . Alcohol use: Yes    Alcohol/week: 2.4 oz    Types: 2 Glasses of wine, 2 Cans of beer per week  . Drug use: No  . Sexual activity: Yes    Birth control/protection: Post-menopausal  Other Topics Concern  . Not on file  Social History Narrative  . Not on file      Review of Systems  All other systems reviewed and are negative.      Objective:   Physical Exam  Constitutional: She appears well-developed and well-nourished. No distress.  HENT:  Right Ear: External ear normal.  Left Ear: External ear normal.  Nose: Rhinorrhea present.  Mouth/Throat: Oropharynx is clear and moist. No oropharyngeal exudate.  Eyes: Conjunctivae are normal. No scleral  icterus.  Neck: Neck supple.  Cardiovascular: Normal rate, regular rhythm and normal heart sounds.  Pulmonary/Chest: Effort normal and breath sounds normal. No respiratory distress. She has no wheezes. She has no rales. She exhibits no tenderness.  Abdominal: Soft. Bowel sounds are normal. She exhibits no distension and no mass. There is no tenderness. There is no rebound and no guarding.  Lymphadenopathy:    She has no cervical adenopathy.  Skin: She is not diaphoretic.          Assessment & Plan:  Diarrhea, unspecified type - Plan: Gastrointestinal Pathogen Panel PCR  Viral URI  I will check a GI pathogen panel to rule out infectious sources of  diarrhea that are common such as Salmonella, Shigella, Escherichia coli, viral causes, C. difficile. If GI pathogen panel is negative and diarrhea persists, I would consider a GI referral for endoscopy to rule out inflammatory bowel disease given the severity of the diarrhea, the weight loss, etc. I explained to the patient that most likely explanation is still likely viral gastroenteritis. I have recommended a probiotic, align, 1 tablet a day over-the-counter to help facilitate resolution of the diarrhea. The remainder of her exam points to a viral upper respiratory infection with mild asthma exacerbation. This will require tincture of time and albuterol 2 puffs every 6 hours as needed.

## 2017-09-30 ENCOUNTER — Ambulatory Visit
Admission: RE | Admit: 2017-09-30 | Discharge: 2017-09-30 | Disposition: A | Discharge status: Home / Self Care | Payer: BLUE CROSS/BLUE SHIELD | Source: Ambulatory Visit | Attending: Family Medicine | Admitting: Family Medicine

## 2017-09-30 ENCOUNTER — Other Ambulatory Visit: Payer: BLUE CROSS/BLUE SHIELD

## 2017-09-30 ENCOUNTER — Telehealth: Payer: Self-pay

## 2017-09-30 DIAGNOSIS — R0689 Other abnormalities of breathing: Secondary | ICD-10-CM

## 2017-09-30 DIAGNOSIS — R05 Cough: Secondary | ICD-10-CM | POA: Diagnosis not present

## 2017-09-30 DIAGNOSIS — R197 Diarrhea, unspecified: Secondary | ICD-10-CM | POA: Diagnosis not present

## 2017-09-30 NOTE — Telephone Encounter (Signed)
Call placed to patient she is aware she need to have chest xray to rule out pneumonia.Order has been placed

## 2017-09-30 NOTE — Telephone Encounter (Signed)
Patient was in office 09/29/2017 and prescribed albuterol for upper respiratory infection. Patient states the inhaler is making her breathing worse she states she used it twice last night, she states she has taking this before with relief.  Patient states she does not want to continue to use the inhaler. Patient states she does contiune to use her [facility]. Pls advise on what you would like patient to do

## 2017-09-30 NOTE — Telephone Encounter (Signed)
Albuterol is for wheezing only.  Continue dulera.  Her exam yesterday was consistent with a viral URI and mild bronchospasm due to asthma.  If worsening, recommend CXR to rule out underlying pneumonia

## 2017-10-03 LAB — GASTROINTESTINAL PATHOGEN PANEL PCR
C. difficile Tox A/B, PCR: NOT DETECTED
CAMPYLOBACTER, PCR: NOT DETECTED
Cryptosporidium, PCR: NOT DETECTED
E COLI (STEC) STX1/STX2, PCR: NOT DETECTED
E coli (ETEC) LT/ST PCR: NOT DETECTED
E coli 0157, PCR: NOT DETECTED
Giardia lamblia, PCR: NOT DETECTED
NOROVIRUS, PCR: NOT DETECTED
ROTAVIRUS, PCR: NOT DETECTED
SALMONELLA, PCR: NOT DETECTED
Shigella, PCR: NOT DETECTED

## 2017-10-04 ENCOUNTER — Other Ambulatory Visit: Payer: Self-pay | Admitting: Family Medicine

## 2017-10-04 ENCOUNTER — Ambulatory Visit: Payer: Self-pay

## 2017-10-04 ENCOUNTER — Ambulatory Visit
Admission: RE | Admit: 2017-10-04 | Discharge: 2017-10-04 | Disposition: A | Discharge status: Home / Self Care | Payer: BLUE CROSS/BLUE SHIELD | Source: Ambulatory Visit | Attending: Family Medicine | Admitting: Family Medicine

## 2017-10-04 ENCOUNTER — Encounter: Payer: Self-pay | Admitting: Gastroenterology

## 2017-10-04 DIAGNOSIS — Z1231 Encounter for screening mammogram for malignant neoplasm of breast: Secondary | ICD-10-CM | POA: Diagnosis not present

## 2017-10-04 DIAGNOSIS — Z139 Encounter for screening, unspecified: Secondary | ICD-10-CM

## 2017-10-04 DIAGNOSIS — R197 Diarrhea, unspecified: Secondary | ICD-10-CM

## 2017-10-07 ENCOUNTER — Other Ambulatory Visit: Payer: Self-pay | Admitting: *Deleted

## 2017-10-07 NOTE — Telephone Encounter (Signed)
Received fax requesting refill on Lomotil.  Ok to refill??  Last office visit 09/29/2017.  Last refill 05/23/2017.

## 2017-10-10 MED ORDER — DIPHENOXYLATE-ATROPINE 2.5-0.025 MG PO TABS: ORAL_TABLET | ORAL | 1 refills | Status: DC

## 2017-10-21 ENCOUNTER — Ambulatory Visit: Payer: BLUE CROSS/BLUE SHIELD | Admitting: Gastroenterology

## 2017-10-21 ENCOUNTER — Other Ambulatory Visit (INDEPENDENT_AMBULATORY_CARE_PROVIDER_SITE_OTHER): Payer: BLUE CROSS/BLUE SHIELD

## 2017-10-21 ENCOUNTER — Encounter (INDEPENDENT_AMBULATORY_CARE_PROVIDER_SITE_OTHER): Payer: Self-pay

## 2017-10-21 ENCOUNTER — Encounter: Payer: Self-pay | Admitting: Gastroenterology

## 2017-10-21 VITALS — BP 106/68 | HR 70 | Ht 65.0 in | Wt 152.1 lb

## 2017-10-21 DIAGNOSIS — R197 Diarrhea, unspecified: Secondary | ICD-10-CM

## 2017-10-21 LAB — IGA: IGA: 168 mg/dL (ref 68–378)

## 2017-10-21 MED ORDER — PEG 3350-KCL-NA BICARB-NACL 420 G PO SOLR: 4000.0000 mL | Freq: Once | ORAL | 0 refills | Status: AC

## 2017-10-21 NOTE — Progress Notes (Signed)
HPI: This is a very pleasant 57 year old woman who was referred to me by Alycia Rossetti, MD  to evaluate chronic diarrhea, abdominal pain.    Chief complaint is   diarrhea, abdominal pain  Anxiety induced IBS for years: intermittent loose/constipation with abd pains.  Stress made it worse, loose.   Periodic purges of loose stools.  One occasion of noctunal loose stools.  5 weeks ago, upper abd pains, intestinal cramping, diarrhea for 1-2 weeks. Will go 6-8 loose stools.  Never bloody.    No fever or chills.  No GI issues sick contacts.  Imodium will help briefly.  Cannot point to diet changes. Cut out dairy for a few days, cut out wheat.  Has lost 6-7 poiunds overall.  No recent Abx.  Was recommended to take align and after 8-9 days of this she felt pretty normal.   Old Data Reviewed: Blood work January 2009 shows normal CBC, normal complete metabolic profile, normal lipase, GI pathogen panel was all negative.    Review of systems: Pertinent positive and negative review of systems were noted in the above HPI section. All other review negative.   Past Medical History:  Diagnosis Date  . Allergy   . Asthma   . Cancer ([place])    skin  . Hyperlipidemia     Past Surgical History:  Procedure Laterality Date  . APPENDECTOMY     57yo    Current Outpatient Medications  Medication Sig Dispense Refill  . albuterol (PROVENTIL HFA;VENTOLIN HFA) 108 (90 Base) MCG/ACT inhaler Inhale 2 puffs into the lungs every 6 (six) hours as needed for wheezing or shortness of breath. 1 Inhaler 0  . cetirizine (ZYRTEC) 10 MG tablet Take 10 mg by mouth daily as needed.     . diphenhydrAMINE (BENADRYL) 25 MG tablet Take 25 mg by mouth every 6 (six) hours as needed.    . diphenoxylate-atropine (LOMOTIL) 2.5-0.025 MG tablet TAKE 1 TO 2 TABLETS BY MOUTH FOUR TIMES DAILY AS NEEDED. 20 tablet 1  . mometasone-formoterol (DULERA) 100-5 MCG/ACT AERO Inhale 2 puffs into the lungs 2 (two) times  daily. 1 Inhaler 2   No current facility-administered medications for this visit.     Allergies as of 10/21/2017 - Review Complete 10/21/2017  Allergen Reaction Noted  . Penicillins Rash 06/06/2015    Family History  Problem Relation Age of Onset  . Hyperlipidemia Mother   . Cancer Mother        metatastic breast cancer  . Breast cancer Mother   . Asthma Father   . Hyperlipidemia Father   . Heart disease Father   . Cancer Father        Prostate  . Early death Sister        after birth   . Early death Brother        after birth     Social History   Socioeconomic History  . Marital status: Married    Spouse name: Not on file  . Number of children: 2  . Years of education: Not on file  . Highest education level: Not on file  Social Needs  . Financial resource strain: Not on file  . Food insecurity - worry: Not on file  . Food insecurity - inability: Not on file  . Transportation needs - medical: Not on file  . Transportation needs - non-medical: Not on file  Occupational History  . Occupation: Self-Empolyed  Tobacco Use  . Smoking status: Never Smoker  . Smokeless  tobacco: Never Used  Substance and Sexual Activity  . Alcohol use: Yes    Alcohol/week: 2.4 oz    Types: 2 Glasses of wine, 2 Cans of beer per week  . Drug use: No  . Sexual activity: Yes    Birth control/protection: Post-menopausal  Other Topics Concern  . Not on file  Social History Narrative  . Not on file     Physical Exam: BP 106/68   Pulse 70   Ht 5' 5"  (1.651 m)   Wt 152 lb 2 oz (69 kg)   BMI 25.31 kg/m  Constitutional: generally well-appearing Psychiatric: alert and oriented x3 Eyes: extraocular movements intact Mouth: oral pharynx moist, no lesions Neck: supple no lymphadenopathy Cardiovascular: heart regular rate and rhythm Lungs: clear to auscultation bilaterally Abdomen: soft, nontender, nondistended, no obvious ascites, no peritoneal signs, normal bowel sounds Extremities:  no lower extremity edema bilaterally Skin: no lesions on visible extremities   Assessment and plan: 57 y.o. female with diarrhea, abdominal pain  This is in the setting of chronic IBS, anxiety induced GI issues for many years.  I think there is a good chance she has postinfectious worsening of irritable bowel syndrome, diarrhea predominance.  Given that she is consistently persistently having loose stools and abdominal pains I recommended further testing with colonoscopy at her soonest convenience to check for IBD, Crohn's, ulcerative colitis, neoplasm.  We discussed potential complications including perforation, bleeding, missing a cancer and she understands and wishes to proceed.  also lab testing today to check for underlying celiac sprue.    Please see the "Patient Instructions" section for addition details about the plan.   Owens Loffler, MD [facility] 10/21/2017, 9:35 AM  Cc: Alycia Rossetti, MD

## 2017-10-21 NOTE — Patient Instructions (Addendum)
You will have labs checked today in the basement lab.  Please head down after you check out with the front desk  (celiac total IgA, tTG).  Take imodium one pill every AM shortly after waking for now.  You will be set up for a colonoscopy (for diarrhea).  Normal BMI (Body Mass Index- based on height and weight) is between 19 and 25. Your BMI today is Body mass index is 25.31 kg/m. Marland Kitchen Please consider follow up  regarding your BMI with your Primary Care Provider.

## 2017-10-24 LAB — TISSUE TRANSGLUTAMINASE, IGA: (TTG) AB, IGA: 1 U/mL

## 2017-10-31 ENCOUNTER — Encounter: Payer: Self-pay | Admitting: Gastroenterology

## 2017-10-31 DIAGNOSIS — H52202 Unspecified astigmatism, left eye: Secondary | ICD-10-CM | POA: Diagnosis not present

## 2017-10-31 DIAGNOSIS — H2512 Age-related nuclear cataract, left eye: Secondary | ICD-10-CM | POA: Diagnosis not present

## 2017-11-14 ENCOUNTER — Other Ambulatory Visit: Payer: Self-pay

## 2017-11-14 ENCOUNTER — Ambulatory Visit (AMBULATORY_SURGERY_CENTER): Payer: BLUE CROSS/BLUE SHIELD | Admitting: Gastroenterology

## 2017-11-14 ENCOUNTER — Encounter: Payer: Self-pay | Admitting: Gastroenterology

## 2017-11-14 VITALS — BP 115/73 | HR 71 | Temp 97.7°F | Resp 19 | Ht 65.0 in | Wt 152.0 lb

## 2017-11-14 DIAGNOSIS — K529 Noninfective gastroenteritis and colitis, unspecified: Secondary | ICD-10-CM | POA: Diagnosis not present

## 2017-11-14 DIAGNOSIS — K5000 Crohn's disease of small intestine without complications: Secondary | ICD-10-CM | POA: Diagnosis not present

## 2017-11-14 DIAGNOSIS — R197 Diarrhea, unspecified: Secondary | ICD-10-CM

## 2017-11-14 MED ORDER — SODIUM CHLORIDE 0.9 % IV SOLN
500.0000 mL | Freq: Once | INTRAVENOUS | Status: AC
Start: 2017-11-14 — End: ?

## 2017-11-14 NOTE — Progress Notes (Signed)
Report to PACU, RN, vss, BBS= Clear.  

## 2017-11-14 NOTE — Progress Notes (Signed)
Pt's states no medical or surgical changes since previsit or office visit. 

## 2017-11-14 NOTE — Patient Instructions (Signed)
Discharge instructions given. Biopsies taken. Handout on diverticulosis and a high fiber diet. Resume previous medications. YOU HAD AN ENDOSCOPIC PROCEDURE TODAY AT [facility]:   Refer to the procedure report that was given to you for any specific questions about what was found during the examination.  If the procedure report does not answer your questions, please call your gastroenterologist to clarify.  If you requested that your care partner not be given the details of your procedure findings, then the procedure report has been included in a sealed envelope for you to review at your convenience later.  YOU SHOULD EXPECT: Some feelings of bloating in the abdomen. Passage of more gas than usual.  Walking can help get rid of the air that was put into your GI tract during the procedure and reduce the bloating. If you had a lower endoscopy (such as a colonoscopy or flexible sigmoidoscopy) you may notice spotting of blood in your stool or on the toilet paper. If you underwent a bowel prep for your procedure, you may not have a normal bowel movement for a few days.  Please Note:  You might notice some irritation and congestion in your nose or some drainage.  This is from the oxygen used during your procedure.  There is no need for concern and it should clear up in a day or so.  SYMPTOMS TO REPORT IMMEDIATELY:   Following lower endoscopy (colonoscopy or flexible sigmoidoscopy):  Excessive amounts of blood in the stool  Significant tenderness or worsening of abdominal pains  Swelling of the abdomen that is new, acute  Fever of 100F or higher   For urgent or emergent issues, a gastroenterologist can be reached at any hour by calling [phone].   DIET:  We do recommend a small meal at first, but then you may proceed to your regular diet.  Drink plenty of fluids but you should avoid alcoholic beverages for 24 hours.  ACTIVITY:  You should plan to take it easy for the rest of  today and you should NOT DRIVE or use heavy machinery until tomorrow (because of the sedation medicines used during the test).    FOLLOW UP: Our staff will call the number listed on your records the next business day following your procedure to check on you and address any questions or concerns that you may have regarding the information given to you following your procedure. If we do not reach you, we will leave a message.  However, if you are feeling well and you are not experiencing any problems, there is no need to return our call.  We will assume that you have returned to your regular daily activities without incident.  If any biopsies were taken you will be contacted by phone or by letter within the next 1-3 weeks.  Please call us at [phone] if you have not heard about the biopsies in 3 weeks.    SIGNATURES/CONFIDENTIALITY: You and/or your care partner have signed paperwork which will be entered into your electronic medical record.  These signatures attest to the fact that that the information above on your After Visit Summary has been reviewed and is understood.  Full responsibility of the confidentiality of this discharge information lies with you and/or your care-partner.

## 2017-11-14 NOTE — Op Note (Signed)
[facility] Patient Name: Paula Beasley Procedure Date: 11/14/2017 9:00 AM MRN: [id number] Endoscopist: Milus Banister , MD Age: 57 Referring MD:  Date of Birth: 05-19-1961 Gender: Female Account #: [id number] Procedure:                Colonoscopy Indications:              Chronic diarrhea Medicines:                Monitored Anesthesia Care Procedure:                Pre-Anesthesia Assessment:                           - Prior to the procedure, a History and Physical                            was performed, and patient medications and                            allergies were reviewed. The patient's tolerance of                            previous anesthesia was also reviewed. The risks                            and benefits of the procedure and the sedation                            options and risks were discussed with the patient.                            All questions were answered, and informed consent                            was obtained. Prior Anticoagulants: The patient has                            taken no previous anticoagulant or antiplatelet                            agents. ASA Grade Assessment: II - A patient with                            mild systemic disease. After reviewing the risks                            and benefits, the patient was deemed in                            satisfactory condition to undergo the procedure.                           After obtaining informed consent, the colonoscope  was passed under direct vision. Throughout the                            procedure, the patient's blood pressure, pulse, and                            oxygen saturations were monitored continuously. The                            Model PCF-H190DL [id number]) scope was introduced                            through the anus and advanced to the the terminal                            ileum. The colonoscopy was performed  without                            difficulty. The patient tolerated the procedure                            well. The quality of the bowel preparation was                            good. The terminal ileum, ileocecal valve,                            appendiceal orifice, and rectum were photographed. Scope In: 9:03:54 AM Scope Out: 9:20:43 AM Scope Withdrawal Time: 0 hours 14 minutes 17 seconds  Total Procedure Duration: 0 hours 16 minutes 49 seconds  Findings:                 The most distal aspect of the terminal ileum was                            focally inflammed (friable, ulcerated),                            circumerentially). This was biopsied (jar 2).                           The distal ileum was normal, biopsied (jar 1).                           The colonic mucosa was normal, biopsied right (jar                            3) and left (jar 4).                           There were a few small diverticulum throughout the                            colon.  The exam was otherwise without abnormality on                            direct and retroflexion views. Complications:            No immediate complications. Estimated blood loss:                            None. Estimated Blood Loss:     Estimated blood loss: none. Impression:               - Most distal aspect of the terminal ileum was                            focally, circumferentially inflammed. IBD>neoplasm.                            Biopsies taken.                           - Remaining distal ileum and colon were normal                            (biopsied).                           - Minor diverticulosis. Recommendation:           - Patient has a contact number available for                            emergencies. The signs and symptoms of potential                            delayed complications were discussed with the                            patient. Return to normal activities  tomorrow.                            Written discharge instructions were provided to the                            patient.                           - Resume previous diet.                           - Continue present medications.                           - Repeat colonoscopy is recommended. The                            colonoscopy date will be determined after pathology  results from today's exam become available for                            review. Milus Banister, MD 11/14/2017 9:27:11 AM This report has been signed electronically.

## 2017-11-14 NOTE — Progress Notes (Signed)
Called to room to assist during endoscopic procedure.  Patient ID and intended procedure confirmed with present staff. Received instructions for my participation in the procedure from the performing physician.  

## 2017-11-15 ENCOUNTER — Telehealth: Payer: Self-pay | Admitting: *Deleted

## 2017-11-15 NOTE — Telephone Encounter (Signed)
  Follow up Call-  Call back number 11/14/2017  Post procedure Call Back phone  # [phone]  Permission to leave phone message Yes  Some recent data might be hidden     Patient questions:  Do you have a fever, pain , or abdominal swelling? No. Pain Score  0 *  Have you tolerated food without any problems? Yes.    Have you been able to return to your normal activities? Yes.    Do you have any questions about your discharge instructions: Diet   No. Medications  No. Follow up visit  No.  Do you have questions or concerns about your Care? No.  Actions: * If pain score is 4 or above: No action needed, pain <4.

## 2017-11-15 NOTE — Telephone Encounter (Signed)
No answer, message left for the patient. 

## 2017-11-21 ENCOUNTER — Other Ambulatory Visit: Payer: Self-pay

## 2017-11-21 DIAGNOSIS — K50119 Crohn's disease of large intestine with unspecified complications: Secondary | ICD-10-CM

## 2017-11-21 NOTE — Progress Notes (Signed)
You have been scheduled for a CT scan of the abdomen and pelvis at [facility] ([address]---this is in the same building as Press [facility]).   You are scheduled on 11/25/17 at 230 pm. You should arrive 15 minutes prior to your appointment time for registration. Please follow the written instructions below on the day of your exam:  WARNING: IF YOU ARE ALLERGIC TO IODINE/X-RAY DYE, PLEASE NOTIFY RADIOLOGY IMMEDIATELY AT [phone]! YOU WILL BE GIVEN A 13 HOUR PREMEDICATION PREP.  1) Do not eat or drink anything after 1030 am (4 hours prior to your test)  You may take any medications as prescribed with a small amount of water except for the following: Metformin, Glucophage, Glucovance, Avandamet, Riomet, Fortamet, Actoplus Met, Janumet, Glumetza or Metaglip. The above medications must be held the day of the exam AND 48 hours after the exam.  The purpose of you drinking the oral contrast is to aid in the visualization of your intestinal tract. The contrast solution may cause some diarrhea. Before your exam is started, you will be given a small amount of fluid to drink. Depending on your individual set of symptoms, you may also receive an intravenous injection of x-ray contrast/dye. Plan on being at [facility] for 30 minutes or longer, depending on the type of exam you are having performed.  This test typically takes 30-45 minutes to complete.  If you have any questions regarding your exam or if you need to reschedule, you may call the CT department at [phone] between the hours of 8:00 am and 5:00 pm, Monday-Friday.  ________________________________________________________________________

## 2017-11-25 ENCOUNTER — Ambulatory Visit (INDEPENDENT_AMBULATORY_CARE_PROVIDER_SITE_OTHER)
Admission: RE | Admit: 2017-11-25 | Discharge: 2017-11-25 | Disposition: A | Discharge status: Home / Self Care | Payer: BLUE CROSS/BLUE SHIELD | Source: Ambulatory Visit | Attending: Gastroenterology | Admitting: Gastroenterology

## 2017-11-25 DIAGNOSIS — K50119 Crohn's disease of large intestine with unspecified complications: Secondary | ICD-10-CM | POA: Diagnosis not present

## 2017-11-25 DIAGNOSIS — R1110 Vomiting, unspecified: Secondary | ICD-10-CM | POA: Diagnosis not present

## 2017-11-25 DIAGNOSIS — R109 Unspecified abdominal pain: Secondary | ICD-10-CM | POA: Diagnosis not present

## 2017-11-25 DIAGNOSIS — R197 Diarrhea, unspecified: Secondary | ICD-10-CM | POA: Diagnosis not present

## 2017-11-25 MED ORDER — IOPAMIDOL (ISOVUE-300) INJECTION 61%
100.0000 mL | Freq: Once | INTRAVENOUS | Status: AC | PRN
  Administered 2017-11-25: 100 mL via INTRAVENOUS

## 2017-11-29 ENCOUNTER — Ambulatory Visit (INDEPENDENT_AMBULATORY_CARE_PROVIDER_SITE_OTHER): Payer: BLUE CROSS/BLUE SHIELD | Admitting: Family Medicine

## 2017-11-29 ENCOUNTER — Encounter: Payer: Self-pay | Admitting: Family Medicine

## 2017-11-29 ENCOUNTER — Other Ambulatory Visit: Payer: Self-pay

## 2017-11-29 VITALS — BP 108/60 | HR 66 | Temp 98.5°F | Resp 14 | Ht 65.0 in | Wt 153.0 lb

## 2017-11-29 DIAGNOSIS — E782 Mixed hyperlipidemia: Secondary | ICD-10-CM

## 2017-11-29 DIAGNOSIS — Z85828 Personal history of other malignant neoplasm of skin: Secondary | ICD-10-CM | POA: Diagnosis not present

## 2017-11-29 DIAGNOSIS — Z0000 Encounter for general adult medical examination without abnormal findings: Secondary | ICD-10-CM | POA: Diagnosis not present

## 2017-11-29 LAB — CBC WITH DIFFERENTIAL/PLATELET
BASOS PCT: 0.3 %
Basophils Absolute: 18 cells/uL (ref 0–200)
EOS ABS: 142 {cells}/uL (ref 15–500)
Eosinophils Relative: 2.4 %
HEMATOCRIT: 43.7 % (ref 35.0–45.0)
Hemoglobin: 14.8 g/dL (ref 11.7–15.5)
LYMPHS ABS: 1546 {cells}/uL (ref 850–3900)
MCH: 28.6 pg (ref 27.0–33.0)
MCHC: 33.9 g/dL (ref 32.0–36.0)
MCV: 84.5 fL (ref 80.0–100.0)
MPV: 10.9 fL (ref 7.5–12.5)
Monocytes Relative: 8.8 %
NEUTROS PCT: 62.3 %
Neutro Abs: 3676 cells/uL (ref 1500–7800)
Platelets: 255 10*3/uL (ref 140–400)
RBC: 5.17 10*6/uL — AB (ref 3.80–5.10)
RDW: 12.7 % (ref 11.0–15.0)
Total Lymphocyte: 26.2 %
WBC: 5.9 10*3/uL (ref 3.8–10.8)
WBCMIX: 519 {cells}/uL (ref 200–950)

## 2017-11-29 LAB — COMPREHENSIVE METABOLIC PANEL
AG Ratio: 1.9 (calc) (ref 1.0–2.5)
ALBUMIN MSPROF: 4.5 g/dL (ref 3.6–5.1)
ALKALINE PHOSPHATASE (APISO): 81 U/L (ref 33–130)
ALT: 14 U/L (ref 6–29)
AST: 18 U/L (ref 10–35)
BILIRUBIN TOTAL: 0.8 mg/dL (ref 0.2–1.2)
BUN: 14 mg/dL (ref 7–25)
CALCIUM: 9.7 mg/dL (ref 8.6–10.4)
CHLORIDE: 104 mmol/L (ref 98–110)
CO2: 24 mmol/L (ref 20–32)
CREATININE: 0.93 mg/dL (ref 0.50–1.05)
GLOBULIN: 2.4 g/dL (ref 1.9–3.7)
Glucose, Bld: 81 mg/dL (ref 65–99)
POTASSIUM: 4.7 mmol/L (ref 3.5–5.3)
Sodium: 138 mmol/L (ref 135–146)
Total Protein: 6.9 g/dL (ref 6.1–8.1)

## 2017-11-29 LAB — LIPID PANEL
Cholesterol: 301 mg/dL — ABNORMAL HIGH (ref <200)
HDL: 55 mg/dL (ref >50)
LDL Cholesterol (Calc): 213 mg/dL (calc) — ABNORMAL HIGH
Non-HDL Cholesterol (Calc): 246 mg/dL (calc) — ABNORMAL HIGH (ref <130)
Total CHOL/HDL Ratio: 5.5 (calc) — ABNORMAL HIGH (ref <5.0)
Triglycerides: 161 mg/dL — ABNORMAL HIGH (ref <150)

## 2017-11-29 NOTE — Patient Instructions (Signed)
Referral  [facility]  F/U 1 year for Physical We will call with lab results

## 2017-11-29 NOTE — Progress Notes (Signed)
   Subjective:    Patient ID: Paula Beasley, female    DOB: 1961/05/06, 57 y.o.   MRN: [id number]  Patient presents for CPE (is fasting)  Pt here for CPE, due for fasting labs  PAP Smear Declines Mammogram- UTD   Colonoscopy UTD done 11/14/17     Dr. Tommy Rainwater- had 2 new lens for cataracts Sept 2018, Feb 2019     No concerns  She will followed by her gastroenterologist concerned that she may have inflammatory bowel disease.  She had a recent CT scan however there is no evidence of Crohn's.  She has follow-up in 2 weeks states that her bowels are near baseline   Review Of Systems:  GEN- denies fatigue, fever, weight loss,weakness, recent illness HEENT- denies eye drainage, change in vision, nasal discharge, CVS- denies chest pain, palpitations RESP- denies SOB, cough, wheeze ABD- denies N/V, change in stools, abd pain GU- denies dysuria, hematuria, dribbling, incontinence MSK- denies joint pain, muscle aches, injury Neuro- denies headache, dizziness, syncope, seizure activity       Objective:    BP 108/60   Pulse 66   Temp 98.5 F (36.9 C) (Oral)   Resp 14   Ht 5' 5"  (1.651 m)   Wt 153 lb (69.4 kg)   SpO2 99%   BMI 25.46 kg/m  GEN- NAD, alert and oriented x3 HEENT- PERRL, EOMI, non injected sclera, pink conjunctiva, MMM, oropharynx clear Neck- Supple, no thyromegaly CVS- RRR, no murmur RESP-CTAB ABD-NABS,soft,NT,ND EXT- No edema Pulses- Radial, DP- 2+        Assessment & Plan:      Problem List Items Addressed This Visit      Unprioritized   Hyperlipidemia   Relevant Orders   Lipid panel (Completed)   History of SCC (squamous cell carcinoma) of skin    Overdue for complete skin check by dermatology history of squamous cell referral placed      Relevant Orders   Ambulatory referral to Dermatology    Other Visit Diagnoses    Routine general medical examination at a health care facility    -  Primary   CPE done, declines immunizations,PAP Smear, labs  done, had HIV testing in past    Relevant Orders   CBC with Differential/Platelet (Completed)   Comprehensive metabolic panel (Completed)      Note: This dictation was prepared with Dragon dictation along with smaller phrase technology. Any transcriptional errors that result from this process are unintentional.

## 2017-11-30 ENCOUNTER — Encounter: Payer: Self-pay | Admitting: Family Medicine

## 2017-11-30 NOTE — Assessment & Plan Note (Signed)
Overdue for complete skin check by dermatology history of squamous cell referral placed

## 2017-12-01 ENCOUNTER — Other Ambulatory Visit: Payer: Self-pay | Admitting: *Deleted

## 2017-12-01 MED ORDER — ATORVASTATIN CALCIUM 20 MG PO TABS: 20.0000 mg | ORAL_TABLET | Freq: Every day | ORAL | 3 refills | Status: DC

## 2017-12-12 ENCOUNTER — Ambulatory Visit: Payer: BLUE CROSS/BLUE SHIELD | Admitting: Gastroenterology

## 2017-12-12 ENCOUNTER — Other Ambulatory Visit: Payer: BLUE CROSS/BLUE SHIELD

## 2017-12-12 ENCOUNTER — Encounter: Payer: Self-pay | Admitting: Gastroenterology

## 2017-12-12 VITALS — BP 110/60 | HR 80 | Ht 64.0 in | Wt 152.1 lb

## 2017-12-12 DIAGNOSIS — R933 Abnormal findings on diagnostic imaging of other parts of digestive tract: Secondary | ICD-10-CM | POA: Diagnosis not present

## 2017-12-12 DIAGNOSIS — K50119 Crohn's disease of large intestine with unspecified complications: Secondary | ICD-10-CM | POA: Diagnosis not present

## 2017-12-12 NOTE — Progress Notes (Signed)
Review of pertinent gastrointestinal problems: 1. Chronic Ileitis; Colonoscopy Dr. Ardis Hughs 11/2017 for loose stools found fairly focal, distal ileum inflammation.  Pathology showed "chronic active inflammation" in ileum, right colon "active inflammation" no granulomas in either areas. CT enterography 11/2017: normal except ? Multiple gastric polyps.     HPI: This is a very pleasant 57 year old woman whom I last saw the time of a colonoscopy about a month ago.  She came in today to discuss test results.  Bowels inconsistent lately;  Will still have some bad days and will take imodiums, even lomotil (twoce a week on average).  No lower abdominal pains generally.  Has had bowel issues dating back ot middle school.  Very rare NSAIDs.  Chief complaint is ileitis  ROS: complete GI ROS as described in HPI, all other review negative.  Constitutional:  No unintentional weight lost ; weight stable, using the same scale here in [facility] office.   Past Medical History:  Diagnosis Date  . Allergy   . Asthma   . Cancer ([place])    skin  . Hyperlipidemia     Past Surgical History:  Procedure Laterality Date  . APPENDECTOMY     57yo    Current Outpatient Medications  Medication Sig Dispense Refill  . albuterol (PROVENTIL HFA;VENTOLIN HFA) 108 (90 Base) MCG/ACT inhaler Inhale 2 puffs into the lungs every 6 (six) hours as needed for wheezing or shortness of breath. 1 Inhaler 0  . atorvastatin (LIPITOR) 20 MG tablet Take 1 tablet (20 mg total) by mouth daily. 90 tablet 3  . cetirizine (ZYRTEC) 10 MG tablet Take 10 mg by mouth daily as needed.     . diphenhydrAMINE (BENADRYL) 25 MG tablet Take 25 mg by mouth every 6 (six) hours as needed.    . diphenoxylate-atropine (LOMOTIL) 2.5-0.025 MG tablet TAKE 1 TO 2 TABLETS BY MOUTH FOUR TIMES DAILY AS NEEDED. 20 tablet 1  . mometasone-formoterol (DULERA) 100-5 MCG/ACT AERO Inhale 2 puffs into the lungs 2 (two) times daily. 1 Inhaler 2   Current  Facility-Administered Medications  Medication Dose Route Frequency Provider Last Rate Last Dose  . 0.9 %  sodium chloride infusion  500 mL Intravenous Once Milus Banister, MD        Allergies as of 12/12/2017 - Review Complete 12/12/2017  Allergen Reaction Noted  . Penicillins Rash 06/06/2015    Family History  Problem Relation Age of Onset  . Hyperlipidemia Mother   . Cancer Mother        metatastic breast cancer  . Breast cancer Mother   . Asthma Father   . Hyperlipidemia Father   . Heart disease Father   . Cancer Father        Prostate  . Early death Sister        after birth   . Early death Brother        after birth     Social History   Socioeconomic History  . Marital status: Married    Spouse name: Not on file  . Number of children: 2  . Years of education: Not on file  . Highest education level: Not on file  Occupational History  . Occupation: Self-Empolyed  Social Needs  . Financial resource strain: Not on file  . Food insecurity:    Worry: Not on file    Inability: Not on file  . Transportation needs:    Medical: Not on file    Non-medical: Not on file  Tobacco Use  .  Smoking status: Never Smoker  . Smokeless tobacco: Never Used  Substance and Sexual Activity  . Alcohol use: Yes    Alcohol/week: 2.4 oz    Types: 2 Glasses of wine, 2 Cans of beer per week  . Drug use: No  . Sexual activity: Yes    Birth control/protection: Post-menopausal  Lifestyle  . Physical activity:    Days per week: Not on file    Minutes per session: Not on file  . Stress: Not on file  Relationships  . Social connections:    Talks on phone: Not on file    Gets together: Not on file    Attends religious service: Not on file    Active member of club or organization: Not on file    Attends meetings of clubs or organizations: Not on file    Relationship status: Not on file  . Intimate partner violence:    Fear of current or ex partner: Not on file    Emotionally  abused: Not on file    Physically abused: Not on file    Forced sexual activity: Not on file  Other Topics Concern  . Not on file  Social History Narrative  . Not on file     Physical Exam: BP 110/60 (BP Location: Left Arm, Patient Position: Sitting, Cuff Size: Normal)   Pulse 80   Ht 5' 4"  (1.626 m) Comment: height measured without shoes  Wt 152 lb 2 oz (69 kg)   BMI 26.11 kg/m  Constitutional: generally well-appearing Psychiatric: alert and oriented x3 Abdomen: soft, nontender, nondistended, no obvious ascites, no peritoneal signs, normal bowel sounds No peripheral edema noted in lower extremities  Assessment and plan: 57 y.o. female with possible Crohn's disease, also abnormal stomach on recent imaging  I am not completely convinced that she has Crohn's disease.  I would like to do some blood work to check for underlying inflammation and also IBD first step type antibody testing.  Clinically her symptoms seem a bit more functional.  Also I cannot put this together with what CT scan suggestion her stomach which is multiple polyps.  Recommended that we definitely proceed with upper endoscopy for further information, data on the gastric polyps.  Please see the "Patient Instructions" section for addition details about the plan.  Owens Loffler, MD [facility] 12/12/2017, 3:26 PM

## 2017-12-12 NOTE — Patient Instructions (Addendum)
You will have labs checked today in the basement lab.  Please head down after you check out with the front desk  (esr, crp, IBD first step blood test). You will be set up for an upper endoscopy for abnormal CT scan of the stomach.  Normal BMI (Body Mass Index- based on height and weight) is between 19 and 25. Your BMI today is Body mass index is 26.11 kg/m. Marland Kitchen Please consider follow up  regarding your BMI with your Primary Care Provider.

## 2017-12-14 LAB — INFLAMMATORY BOWEL DISEASE-IBD
Saccharomyces cerevisiae, IgA: <20 Units (ref 0.0–24.9)
Saccharomyces cerevisiae, IgG: <20 Units (ref 0.0–24.9)

## 2017-12-26 ENCOUNTER — Telehealth: Payer: Self-pay

## 2017-12-26 NOTE — Telephone Encounter (Signed)
Dr Ardis Hughs the pt would like to speak with you sooner than procedure appt about test results.

## 2018-01-06 ENCOUNTER — Encounter: Payer: BLUE CROSS/BLUE SHIELD | Admitting: Gastroenterology

## 2018-01-16 NOTE — Telephone Encounter (Signed)
We spoke, I believe I answered all her questions.  In general I am not convinced that she has inflammatory bowel disease, Crohn's.   her symptoms are slowly improving with time.  Possibly she had some postinfectious process.  She understands and agrees to simply follow her clinically.  She is scheduled for upper endoscopy to investigate polyps that were noted by CAT scan.  That is in about 5 weeks.  She will call here sooner if she has any questions or concerns.

## 2018-02-15 ENCOUNTER — Encounter: Payer: Self-pay | Admitting: Gastroenterology

## 2018-02-24 ENCOUNTER — Encounter: Payer: BLUE CROSS/BLUE SHIELD | Admitting: Gastroenterology

## 2018-03-21 DIAGNOSIS — D225 Melanocytic nevi of trunk: Secondary | ICD-10-CM | POA: Diagnosis not present

## 2018-03-21 DIAGNOSIS — C44619 Basal cell carcinoma of skin of left upper limb, including shoulder: Secondary | ICD-10-CM | POA: Diagnosis not present

## 2018-03-21 DIAGNOSIS — L814 Other melanin hyperpigmentation: Secondary | ICD-10-CM | POA: Diagnosis not present

## 2018-03-21 DIAGNOSIS — Z85828 Personal history of other malignant neoplasm of skin: Secondary | ICD-10-CM | POA: Diagnosis not present

## 2018-03-21 DIAGNOSIS — L570 Actinic keratosis: Secondary | ICD-10-CM | POA: Diagnosis not present

## 2018-03-21 DIAGNOSIS — D485 Neoplasm of uncertain behavior of skin: Secondary | ICD-10-CM | POA: Diagnosis not present

## 2018-04-11 ENCOUNTER — Telehealth: Payer: Self-pay

## 2018-04-11 NOTE — Telephone Encounter (Signed)
Dr Jac Canavan

## 2018-04-11 NOTE — Telephone Encounter (Signed)
Per pt she wants Dr. Ardis Hughs to be aware that she has cancelled her procedure due to finances. She also states that she is feeling better, and at this time she cannot add any more bills.

## 2018-05-01 ENCOUNTER — Ambulatory Visit: Payer: BLUE CROSS/BLUE SHIELD | Admitting: Family Medicine

## 2018-05-01 ENCOUNTER — Encounter: Payer: Self-pay | Admitting: Family Medicine

## 2018-05-01 VITALS — BP 110/74 | HR 60 | Temp 98.2°F | Resp 16 | Ht 64.0 in | Wt 157.0 lb

## 2018-05-01 DIAGNOSIS — B9689 Other specified bacterial agents as the cause of diseases classified elsewhere: Secondary | ICD-10-CM | POA: Diagnosis not present

## 2018-05-01 DIAGNOSIS — J019 Acute sinusitis, unspecified: Secondary | ICD-10-CM

## 2018-05-01 MED ORDER — AZITHROMYCIN 250 MG PO TABS: ORAL_TABLET | ORAL | 0 refills | Status: DC

## 2018-05-01 MED ORDER — FLUTICASONE PROPIONATE 50 MCG/ACT NA SUSP: 2.0000 | Freq: Every day | NASAL | 11 refills | Status: DC

## 2018-05-01 NOTE — Progress Notes (Signed)
Subjective:    Patient ID: Paula Beasley, female    DOB: 05-11-1961, 57 y.o.   MRN: [id number]  HPI Patient reports a 2-week history of head congestion, rhinorrhea, head pressure.  She reports pain and pressure in her frontal and maxillary sinuses left greater than right.  She is tried Zyrtec.  She is tried Mucinex.  Unfortunately it has worsened.  She now has tenderness to percussion of her left frontal and left maxillary sinus.  She has a dull constant headache.  She denies any fevers or chills.  She denies any cough.  She reports copious rhinorrhea and postnasal drip. Past Medical History:  Diagnosis Date  . Allergy   . Asthma   . Cancer ([place])    skin  . Hyperlipidemia    Past Surgical History:  Procedure Laterality Date  . APPENDECTOMY     57yo   Current Outpatient Medications on File Prior to Visit  Medication Sig Dispense Refill  . albuterol (PROVENTIL HFA;VENTOLIN HFA) 108 (90 Base) MCG/ACT inhaler Inhale 2 puffs into the lungs every 6 (six) hours as needed for wheezing or shortness of breath. 1 Inhaler 0  . atorvastatin (LIPITOR) 20 MG tablet Take 1 tablet (20 mg total) by mouth daily. 90 tablet 3  . cetirizine (ZYRTEC) 10 MG tablet Take 10 mg by mouth daily as needed.     . diphenhydrAMINE (BENADRYL) 25 MG tablet Take 25 mg by mouth every 6 (six) hours as needed.    . diphenoxylate-atropine (LOMOTIL) 2.5-0.025 MG tablet TAKE 1 TO 2 TABLETS BY MOUTH FOUR TIMES DAILY AS NEEDED. 20 tablet 1  . mometasone-formoterol (DULERA) 100-5 MCG/ACT AERO Inhale 2 puffs into the lungs 2 (two) times daily. 1 Inhaler 2   Current Facility-Administered Medications on File Prior to Visit  Medication Dose Route Frequency Provider Last Rate Last Dose  . 0.9 %  sodium chloride infusion  500 mL Intravenous Once Milus Banister, MD       Allergies  Allergen Reactions  . Penicillins Rash   Social History   Socioeconomic History  . Marital status: Married    Spouse name: Not on file  . Number  of children: 2  . Years of education: Not on file  . Highest education level: Not on file  Occupational History  . Occupation: Self-Empolyed  Social Needs  . Financial resource strain: Not on file  . Food insecurity:    Worry: Not on file    Inability: Not on file  . Transportation needs:    Medical: Not on file    Non-medical: Not on file  Tobacco Use  . Smoking status: Never Smoker  . Smokeless tobacco: Never Used  Substance and Sexual Activity  . Alcohol use: Yes    Alcohol/week: 4.0 standard drinks    Types: 2 Glasses of wine, 2 Cans of beer per week  . Drug use: No  . Sexual activity: Yes    Birth control/protection: Post-menopausal  Lifestyle  . Physical activity:    Days per week: Not on file    Minutes per session: Not on file  . Stress: Not on file  Relationships  . Social connections:    Talks on phone: Not on file    Gets together: Not on file    Attends religious service: Not on file    Active member of club or organization: Not on file    Attends meetings of clubs or organizations: Not on file    Relationship status: Not on  file  . Intimate partner violence:    Fear of current or ex partner: Not on file    Emotionally abused: Not on file    Physically abused: Not on file    Forced sexual activity: Not on file  Other Topics Concern  . Not on file  Social History Narrative  . Not on file      Review of Systems  All other systems reviewed and are negative.      Objective:   Physical Exam  Constitutional: She appears well-developed and well-nourished. No distress.  HENT:  Right Ear: Tympanic membrane, external ear and ear canal normal.  Left Ear: Tympanic membrane, external ear and ear canal normal.  Nose: Mucosal edema and rhinorrhea present. Left sinus exhibits maxillary sinus tenderness and frontal sinus tenderness.  Mouth/Throat: Oropharynx is clear and moist. No oropharyngeal exudate.  Eyes: Conjunctivae are normal. No scleral icterus.    Neck: Neck supple.  Cardiovascular: Normal rate, regular rhythm and normal heart sounds.  Pulmonary/Chest: Effort normal and breath sounds normal. No respiratory distress. She has no wheezes. She has no rales. She exhibits no tenderness.  Lymphadenopathy:    She has no cervical adenopathy.  Skin: She is not diaphoretic.          Assessment & Plan:  Acute bacterial sinusitis - Plan: azithromycin (ZITHROMAX) 250 MG tablet  I believe the patient has a bacterial sinus infection likely triggered by allergies to ragweed that started and has subsequently become secondarily infected.  I have recommended starting Flonase 2 sprays each nostril daily as well as a Z-Pak for the sinus infection.  I have also recommended nasal saline in an effort to help flush empty the sinuses.  If symptoms worsen, consider oral steroids and/or Levaquin.

## 2018-05-03 ENCOUNTER — Encounter: Payer: BLUE CROSS/BLUE SHIELD | Admitting: Gastroenterology

## 2018-07-30 ENCOUNTER — Other Ambulatory Visit: Payer: Self-pay | Admitting: Family Medicine

## 2018-09-03 ENCOUNTER — Encounter: Payer: Self-pay | Admitting: Family Medicine

## 2018-09-04 MED ORDER — MOMETASONE FURO-FORMOTEROL FUM 100-5 MCG/ACT IN AERO: 2.0000 | INHALATION_SPRAY | Freq: Two times a day (BID) | RESPIRATORY_TRACT | 2 refills | Status: DC

## 2018-09-06 DIAGNOSIS — J019 Acute sinusitis, unspecified: Secondary | ICD-10-CM | POA: Diagnosis not present

## 2018-09-06 DIAGNOSIS — J45901 Unspecified asthma with (acute) exacerbation: Secondary | ICD-10-CM | POA: Diagnosis not present

## 2018-09-07 ENCOUNTER — Telehealth: Payer: Self-pay | Admitting: *Deleted

## 2018-09-07 NOTE — Telephone Encounter (Signed)
[facility] is reviewing your PA request. Typically an electronic response will be received within 72 hours. To check for an update later, open this request from your dashboard.    You may close this dialog and return to your dashboard to perform other tasks.

## 2018-09-07 NOTE — Telephone Encounter (Signed)
Received request from pharmacy for [facility] on [facility].   PA submitted.   Dx: [id number] [phone]

## 2018-09-18 NOTE — Telephone Encounter (Signed)
She can try Symbicort 70m 2 puff BID Or Breo 1072monce a day  Both are inhaled steroids  I AM not sure if she has tried either. If not, I would try the Symbicort first  She can come get coupon card if needed

## 2018-09-18 NOTE — Telephone Encounter (Signed)
Received PA determination.   PA denied as medication is not listed on formulary of approved medications.   Advised to discuss alternatives with provider.   MD please advise.

## 2018-09-18 NOTE — Telephone Encounter (Signed)
Patient aware per MyChart.   Will wait for patient response.

## 2018-10-11 ENCOUNTER — Encounter: Payer: Self-pay | Admitting: Family Medicine

## 2018-10-13 MED ORDER — BUDESONIDE-FORMOTEROL FUMARATE 160-4.5 MCG/ACT IN AERO: 2.0000 | INHALATION_SPRAY | Freq: Two times a day (BID) | RESPIRATORY_TRACT | 3 refills | Status: DC

## 2018-10-23 ENCOUNTER — Other Ambulatory Visit: Payer: Self-pay | Admitting: Family Medicine

## 2018-10-23 NOTE — Telephone Encounter (Signed)
Ok to refill??  Last office visit 05/01/2018.  Last refill 10/10/2017, #1 refill.

## 2018-12-03 ENCOUNTER — Other Ambulatory Visit: Payer: Self-pay | Admitting: Family Medicine

## 2019-01-11 DIAGNOSIS — M62830 Muscle spasm of back: Secondary | ICD-10-CM | POA: Diagnosis not present

## 2019-01-11 DIAGNOSIS — M542 Cervicalgia: Secondary | ICD-10-CM | POA: Diagnosis not present

## 2019-01-11 DIAGNOSIS — M9902 Segmental and somatic dysfunction of thoracic region: Secondary | ICD-10-CM | POA: Diagnosis not present

## 2019-01-11 DIAGNOSIS — M9901 Segmental and somatic dysfunction of cervical region: Secondary | ICD-10-CM | POA: Diagnosis not present

## 2019-01-18 DIAGNOSIS — M9902 Segmental and somatic dysfunction of thoracic region: Secondary | ICD-10-CM | POA: Diagnosis not present

## 2019-01-18 DIAGNOSIS — M542 Cervicalgia: Secondary | ICD-10-CM | POA: Diagnosis not present

## 2019-01-18 DIAGNOSIS — M62830 Muscle spasm of back: Secondary | ICD-10-CM | POA: Diagnosis not present

## 2019-01-18 DIAGNOSIS — M9901 Segmental and somatic dysfunction of cervical region: Secondary | ICD-10-CM | POA: Diagnosis not present

## 2019-01-22 DIAGNOSIS — M62830 Muscle spasm of back: Secondary | ICD-10-CM | POA: Diagnosis not present

## 2019-01-22 DIAGNOSIS — M9901 Segmental and somatic dysfunction of cervical region: Secondary | ICD-10-CM | POA: Diagnosis not present

## 2019-01-22 DIAGNOSIS — M9902 Segmental and somatic dysfunction of thoracic region: Secondary | ICD-10-CM | POA: Diagnosis not present

## 2019-01-22 DIAGNOSIS — M542 Cervicalgia: Secondary | ICD-10-CM | POA: Diagnosis not present

## 2019-01-24 DIAGNOSIS — M9902 Segmental and somatic dysfunction of thoracic region: Secondary | ICD-10-CM | POA: Diagnosis not present

## 2019-01-24 DIAGNOSIS — M62830 Muscle spasm of back: Secondary | ICD-10-CM | POA: Diagnosis not present

## 2019-01-24 DIAGNOSIS — M9901 Segmental and somatic dysfunction of cervical region: Secondary | ICD-10-CM | POA: Diagnosis not present

## 2019-01-24 DIAGNOSIS — M542 Cervicalgia: Secondary | ICD-10-CM | POA: Diagnosis not present

## 2019-01-25 DIAGNOSIS — M9901 Segmental and somatic dysfunction of cervical region: Secondary | ICD-10-CM | POA: Diagnosis not present

## 2019-01-25 DIAGNOSIS — M62830 Muscle spasm of back: Secondary | ICD-10-CM | POA: Diagnosis not present

## 2019-01-25 DIAGNOSIS — M9902 Segmental and somatic dysfunction of thoracic region: Secondary | ICD-10-CM | POA: Diagnosis not present

## 2019-01-25 DIAGNOSIS — M542 Cervicalgia: Secondary | ICD-10-CM | POA: Diagnosis not present

## 2019-01-30 DIAGNOSIS — M9902 Segmental and somatic dysfunction of thoracic region: Secondary | ICD-10-CM | POA: Diagnosis not present

## 2019-01-30 DIAGNOSIS — M9901 Segmental and somatic dysfunction of cervical region: Secondary | ICD-10-CM | POA: Diagnosis not present

## 2019-01-30 DIAGNOSIS — M542 Cervicalgia: Secondary | ICD-10-CM | POA: Diagnosis not present

## 2019-01-30 DIAGNOSIS — M62830 Muscle spasm of back: Secondary | ICD-10-CM | POA: Diagnosis not present

## 2019-01-31 DIAGNOSIS — M542 Cervicalgia: Secondary | ICD-10-CM | POA: Diagnosis not present

## 2019-01-31 DIAGNOSIS — M9902 Segmental and somatic dysfunction of thoracic region: Secondary | ICD-10-CM | POA: Diagnosis not present

## 2019-01-31 DIAGNOSIS — M9901 Segmental and somatic dysfunction of cervical region: Secondary | ICD-10-CM | POA: Diagnosis not present

## 2019-01-31 DIAGNOSIS — M62830 Muscle spasm of back: Secondary | ICD-10-CM | POA: Diagnosis not present

## 2019-02-01 DIAGNOSIS — M9902 Segmental and somatic dysfunction of thoracic region: Secondary | ICD-10-CM | POA: Diagnosis not present

## 2019-02-01 DIAGNOSIS — M9901 Segmental and somatic dysfunction of cervical region: Secondary | ICD-10-CM | POA: Diagnosis not present

## 2019-02-01 DIAGNOSIS — M542 Cervicalgia: Secondary | ICD-10-CM | POA: Diagnosis not present

## 2019-02-01 DIAGNOSIS — M62830 Muscle spasm of back: Secondary | ICD-10-CM | POA: Diagnosis not present

## 2019-02-05 DIAGNOSIS — M542 Cervicalgia: Secondary | ICD-10-CM | POA: Diagnosis not present

## 2019-02-05 DIAGNOSIS — M9902 Segmental and somatic dysfunction of thoracic region: Secondary | ICD-10-CM | POA: Diagnosis not present

## 2019-02-05 DIAGNOSIS — M9901 Segmental and somatic dysfunction of cervical region: Secondary | ICD-10-CM | POA: Diagnosis not present

## 2019-02-05 DIAGNOSIS — M62830 Muscle spasm of back: Secondary | ICD-10-CM | POA: Diagnosis not present

## 2019-02-07 DIAGNOSIS — M62830 Muscle spasm of back: Secondary | ICD-10-CM | POA: Diagnosis not present

## 2019-02-07 DIAGNOSIS — M9902 Segmental and somatic dysfunction of thoracic region: Secondary | ICD-10-CM | POA: Diagnosis not present

## 2019-02-07 DIAGNOSIS — M9901 Segmental and somatic dysfunction of cervical region: Secondary | ICD-10-CM | POA: Diagnosis not present

## 2019-02-07 DIAGNOSIS — M542 Cervicalgia: Secondary | ICD-10-CM | POA: Diagnosis not present

## 2019-02-08 DIAGNOSIS — M62830 Muscle spasm of back: Secondary | ICD-10-CM | POA: Diagnosis not present

## 2019-02-08 DIAGNOSIS — M542 Cervicalgia: Secondary | ICD-10-CM | POA: Diagnosis not present

## 2019-02-08 DIAGNOSIS — M9901 Segmental and somatic dysfunction of cervical region: Secondary | ICD-10-CM | POA: Diagnosis not present

## 2019-02-08 DIAGNOSIS — M9902 Segmental and somatic dysfunction of thoracic region: Secondary | ICD-10-CM | POA: Diagnosis not present

## 2019-02-12 DIAGNOSIS — M9901 Segmental and somatic dysfunction of cervical region: Secondary | ICD-10-CM | POA: Diagnosis not present

## 2019-02-12 DIAGNOSIS — M9902 Segmental and somatic dysfunction of thoracic region: Secondary | ICD-10-CM | POA: Diagnosis not present

## 2019-02-12 DIAGNOSIS — M62830 Muscle spasm of back: Secondary | ICD-10-CM | POA: Diagnosis not present

## 2019-02-12 DIAGNOSIS — M542 Cervicalgia: Secondary | ICD-10-CM | POA: Diagnosis not present

## 2019-02-14 DIAGNOSIS — M9902 Segmental and somatic dysfunction of thoracic region: Secondary | ICD-10-CM | POA: Diagnosis not present

## 2019-02-14 DIAGNOSIS — M62830 Muscle spasm of back: Secondary | ICD-10-CM | POA: Diagnosis not present

## 2019-02-14 DIAGNOSIS — M9901 Segmental and somatic dysfunction of cervical region: Secondary | ICD-10-CM | POA: Diagnosis not present

## 2019-02-14 DIAGNOSIS — M542 Cervicalgia: Secondary | ICD-10-CM | POA: Diagnosis not present

## 2019-02-19 DIAGNOSIS — M542 Cervicalgia: Secondary | ICD-10-CM | POA: Diagnosis not present

## 2019-02-19 DIAGNOSIS — M9902 Segmental and somatic dysfunction of thoracic region: Secondary | ICD-10-CM | POA: Diagnosis not present

## 2019-02-19 DIAGNOSIS — M9901 Segmental and somatic dysfunction of cervical region: Secondary | ICD-10-CM | POA: Diagnosis not present

## 2019-02-19 DIAGNOSIS — M62830 Muscle spasm of back: Secondary | ICD-10-CM | POA: Diagnosis not present

## 2019-02-20 ENCOUNTER — Other Ambulatory Visit: Payer: Self-pay

## 2019-02-20 ENCOUNTER — Other Ambulatory Visit: Payer: BC Managed Care – PPO

## 2019-02-20 DIAGNOSIS — E785 Hyperlipidemia, unspecified: Secondary | ICD-10-CM

## 2019-02-21 LAB — LIPID PANEL
Cholesterol: 144 mg/dL (ref <200)
HDL: 51 mg/dL (ref >=50)
LDL Cholesterol (Calc): 79 mg/dL (calc)
Non-HDL Cholesterol (Calc): 93 mg/dL (calc) (ref <130)
Total CHOL/HDL Ratio: 2.8 (calc) (ref <5.0)
Triglycerides: 67 mg/dL (ref <150)

## 2019-02-22 ENCOUNTER — Other Ambulatory Visit: Payer: Self-pay

## 2019-02-22 ENCOUNTER — Encounter: Payer: Self-pay | Admitting: Family Medicine

## 2019-02-22 ENCOUNTER — Ambulatory Visit (INDEPENDENT_AMBULATORY_CARE_PROVIDER_SITE_OTHER): Payer: BC Managed Care – PPO | Admitting: Family Medicine

## 2019-02-22 VITALS — BP 112/66 | HR 70 | Temp 98.1°F | Resp 14 | Ht 64.0 in | Wt 156.0 lb

## 2019-02-22 DIAGNOSIS — Z1239 Encounter for other screening for malignant neoplasm of breast: Secondary | ICD-10-CM | POA: Diagnosis not present

## 2019-02-22 DIAGNOSIS — J4520 Mild intermittent asthma, uncomplicated: Secondary | ICD-10-CM

## 2019-02-22 DIAGNOSIS — E782 Mixed hyperlipidemia: Secondary | ICD-10-CM

## 2019-02-22 DIAGNOSIS — M62830 Muscle spasm of back: Secondary | ICD-10-CM | POA: Diagnosis not present

## 2019-02-22 DIAGNOSIS — Z0001 Encounter for general adult medical examination with abnormal findings: Secondary | ICD-10-CM

## 2019-02-22 DIAGNOSIS — M9902 Segmental and somatic dysfunction of thoracic region: Secondary | ICD-10-CM | POA: Diagnosis not present

## 2019-02-22 DIAGNOSIS — M6289 Other specified disorders of muscle: Secondary | ICD-10-CM

## 2019-02-22 DIAGNOSIS — Z0000 Encounter for general adult medical examination without abnormal findings: Secondary | ICD-10-CM

## 2019-02-22 DIAGNOSIS — M9901 Segmental and somatic dysfunction of cervical region: Secondary | ICD-10-CM | POA: Diagnosis not present

## 2019-02-22 DIAGNOSIS — I8390 Asymptomatic varicose veins of unspecified lower extremity: Secondary | ICD-10-CM | POA: Insufficient documentation

## 2019-02-22 DIAGNOSIS — I8393 Asymptomatic varicose veins of bilateral lower extremities: Secondary | ICD-10-CM

## 2019-02-22 DIAGNOSIS — M542 Cervicalgia: Secondary | ICD-10-CM | POA: Diagnosis not present

## 2019-02-22 NOTE — Patient Instructions (Addendum)
Follow up in one year with PCP for annual exam  Sooner as needed   Calcium 1200 mg daily and Vit D 1000 IU   Preventing Osteoporosis, Adult Osteoporosis is a condition that causes the bones to get weaker. With osteoporosis, the bones become thinner, and the normal spaces in bone tissue become larger. This can make the bones weak and cause them to break more easily. People who have osteoporosis are more likely to break their wrist, spine, or hip. Even a minor accident or injury can be enough to break weak bones. Osteoporosis can occur with aging. Your body constantly replaces old bone tissue with new tissue. As you get older, you may lose bone tissue more quickly, or it may be replaced more slowly. Osteoporosis is more likely to develop if you have poor nutrition or do not get enough calcium or vitamin D. Other lifestyle factors can also play a role. By making some diet and lifestyle changes, you can help to keep your bones healthy and help to prevent osteoporosis. What nutrition changes can be made? Nutrition plays an important role in maintaining healthy, strong bones.  Make sure you get enough calcium every day from food or from calcium supplements. ? If you are age 60 or younger, aim to get 1,000 mg of calcium every day. ? If you are older than age 60, aim to get 1,200 mg of calcium every day.  Try to get enough vitamin D every day. ? If you are age 60 or younger, aim to get 600 international units (IU) every day. ? If you are older than age 60, aim to get 800 international units every day.  Follow a healthy diet. Eat plenty of foods that contain calcium and vitamin D. ? Calcium is in milk, cheese, yogurt, and other dairy products. Some fish and vegetables are also good sources of calcium. Many foods such as cereals and breads have had calcium added to them (are fortified). Check nutrition labels to see how much calcium is in a food or drink. ? Foods that contain vitamin D include milk,  cereals, salmon, and tuna. Your body also makes vitamin D when you are out in the sun. Bare skin exposure to the sun on your face, arms, legs, or back for no more than 30 minutes a day, 2 times per week is more than enough. Beyond that, it is important to use sunblock to protect your skin from sunburn, which increases your risk for skin cancer. What lifestyle changes can be made? Making changes in your everyday life can also play an important role in preventing osteoporosis.  Stay active and get exercise every day. Ask your health care provider what types of exercise are best for you.  Do not use any products that contain nicotine or tobacco, such as cigarettes and e-cigarettes. If you need help quitting, ask your health care provider.  Limit alcohol intake to no more than 1 drink a day for nonpregnant women and 2 drinks a day for men. One drink equals 12 oz of beer, 5 oz of wine, or 1 oz of hard liquor. Why are these changes important? Making these nutrition and lifestyle changes can:  Help you develop and maintain healthy, strong bones.  Prevent loss of bone mass and the problems that are caused by that loss, such as broken bones and delayed healing.  Make you feel better mentally and physically. What can happen if changes are not made? Problems that can result from osteoporosis can be very  serious. These may include:  A higher risk of broken bones that are painful and do not heal well.  Physical malformations, such as a collapsed spine or a hunched back.  Problems with movement. Where to find support If you need help making changes to prevent osteoporosis, talk with your health care provider. You can ask for a referral to a diet and nutrition specialist (dietitian) and a physical therapist. Where to find more information Learn more about osteoporosis from:  NIH Osteoporosis and Related [place]:  www.niams.GolfingGoddess.com.br  U.S. Office on Women's Health: SouvenirBaseball.es.html  National Osteoporosis Foundation: ProfilePeek.ch Summary  Osteoporosis is a condition that causes weak bones that are more likely to break.  Eating a healthy diet and making sure you get enough calcium and vitamin D can help prevent osteoporosis.  Other ways to reduce your risk of osteoporosis include getting regular exercise and avoiding alcohol and products that contain nicotine or tobacco. This information is not intended to replace advice given to you by your health care provider. Make sure you discuss any questions you have with your health care provider. Document Released: 09/07/2015 Document Revised: 05/31/2017 Document Reviewed: 05/03/2016 Elsevier Interactive Patient Education  2019 [place] Maintenance, Female Adopting a healthy lifestyle and getting preventive care can go a long way to promote health and wellness. Talk with your health care provider about what schedule of regular examinations is right for you. This is a good chance for you to check in with your provider about disease prevention and staying healthy. In between checkups, there are plenty of things you can do on your own. Experts have done a lot of research about which lifestyle changes and preventive measures are most likely to keep you healthy. Ask your health care provider for more information. Weight and diet Eat a healthy diet  Be sure to include plenty of vegetables, fruits, low-fat dairy products, and lean protein.  Do not eat a lot of foods high in solid fats, added sugars, or salt.  Get regular exercise. This is one of the most important things you can do for your health. ? Most adults should exercise for at least 150 minutes each week. The exercise should increase your heart rate  and make you sweat (moderate-intensity exercise). ? Most adults should also do strengthening exercises at least twice a week. This is in addition to the moderate-intensity exercise. Maintain a healthy weight  Body mass index (BMI) is a measurement that can be used to identify possible weight problems. It estimates body fat based on height and weight. Your health care provider can help determine your BMI and help you achieve or maintain a healthy weight.  For females 58 years of age and older: ? A BMI below 18.5 is considered underweight. ? A BMI of 18.5 to 24.9 is normal. ? A BMI of 25 to 29.9 is considered overweight. ? A BMI of 30 and above is considered obese. Watch levels of cholesterol and blood lipids  You should start having your blood tested for lipids and cholesterol at 60 years of age, then have this test every 5 years.  You may need to have your cholesterol levels checked more often if: ? Your lipid or cholesterol levels are high. ? You are older than 60 years of age. ? You are at high risk for heart disease. Cancer screening Lung Cancer  Lung cancer screening is recommended for adults 69-43 years old who are at high risk for lung  cancer because of a history of smoking.  A yearly low-dose CT scan of the lungs is recommended for people who: ? Currently smoke. ? Have quit within the past 15 years. ? Have at least a 30-pack-year history of smoking. A pack year is smoking an average of one pack of cigarettes a day for 1 year.  Yearly screening should continue until it has been 15 years since you quit.  Yearly screening should stop if you develop a health problem that would prevent you from having lung cancer treatment. Breast Cancer  Practice breast self-awareness. This means understanding how your breasts normally appear and feel.  It also means doing regular breast self-exams. Let your health care provider know about any changes, no matter how small.  If you are in your  20s or 30s, you should have a clinical breast exam (CBE) by a health care provider every 1-3 years as part of a regular health exam.  If you are 53 or older, have a CBE every year. Also consider having a breast X-ray (mammogram) every year.  If you have a family history of breast cancer, talk to your health care provider about genetic screening.  If you are at high risk for breast cancer, talk to your health care provider about having an MRI and a mammogram every year.  Breast cancer gene (BRCA) assessment is recommended for women who have family members with BRCA-related cancers. BRCA-related cancers include: ? Breast. ? Ovarian. ? Tubal. ? Peritoneal cancers.  Results of the assessment will determine the need for genetic counseling and BRCA1 and BRCA2 testing. Cervical Cancer Your health care provider may recommend that you be screened regularly for cancer of the pelvic organs (ovaries, uterus, and vagina). This screening involves a pelvic examination, including checking for microscopic changes to the surface of your cervix (Pap test). You may be encouraged to have this screening done every 3 years, beginning at age 72.  For women ages 105-65, health care providers may recommend pelvic exams and Pap testing every 3 years, or they may recommend the Pap and pelvic exam, combined with testing for human papilloma virus (HPV), every 5 years. Some types of HPV increase your risk of cervical cancer. Testing for HPV may also be done on women of any age with unclear Pap test results.  Other health care providers may not recommend any screening for nonpregnant women who are considered low risk for pelvic cancer and who do not have symptoms. Ask your health care provider if a screening pelvic exam is right for you.  If you have had past treatment for cervical cancer or a condition that could lead to cancer, you need Pap tests and screening for cancer for at least 20 years after your treatment. If Pap  tests have been discontinued, your risk factors (such as having a new sexual partner) need to be reassessed to determine if screening should resume. Some women have medical problems that increase the chance of getting cervical cancer. In these cases, your health care provider may recommend more frequent screening and Pap tests. Colorectal Cancer  This type of cancer can be detected and often prevented.  Routine colorectal cancer screening usually begins at 60 years of age and continues through 60 years of age.  Your health care provider may recommend screening at an earlier age if you have risk factors for colon cancer.  Your health care provider may also recommend using home test kits to check for hidden blood in the stool.  A  small camera at the end of a tube can be used to examine your colon directly (sigmoidoscopy or colonoscopy). This is done to check for the earliest forms of colorectal cancer.  Routine screening usually begins at age 81.  Direct examination of the colon should be repeated every 5-10 years through 60 years of age. However, you may need to be screened more often if early forms of precancerous polyps or small growths are found. Skin Cancer  Check your skin from head to toe regularly.  Tell your health care provider about any new moles or changes in moles, especially if there is a change in a mole's shape or color.  Also tell your health care provider if you have a mole that is larger than the size of a pencil eraser.  Always use sunscreen. Apply sunscreen liberally and repeatedly throughout the day.  Protect yourself by wearing long sleeves, pants, a wide-brimmed hat, and sunglasses whenever you are outside. Heart disease, diabetes, and high blood pressure  High blood pressure causes heart disease and increases the risk of stroke. High blood pressure is more likely to develop in: ? People who have blood pressure in the high end of the normal range (130-139/85-89 mm  Hg). ? People who are overweight or obese. ? People who are African American.  If you are 33-67 years of age, have your blood pressure checked every 3-5 years. If you are 40 years of age or older, have your blood pressure checked every year. You should have your blood pressure measured twice-once when you are at a hospital or clinic, and once when you are not at a hospital or clinic. Record the average of the two measurements. To check your blood pressure when you are not at a hospital or clinic, you can use: ? An automated blood pressure machine at a pharmacy. ? A home blood pressure monitor.  If you are between 33 years and 73 years old, ask your health care provider if you should take aspirin to prevent strokes.  Have regular diabetes screenings. This involves taking a blood sample to check your fasting blood sugar level. ? If you are at a normal weight and have a low risk for diabetes, have this test once every three years after 60 years of age. ? If you are overweight and have a high risk for diabetes, consider being tested at a younger age or more often. Preventing infection Hepatitis B  If you have a higher risk for hepatitis B, you should be screened for this virus. You are considered at high risk for hepatitis B if: ? You were born in a country where hepatitis B is common. Ask your health care provider which countries are considered high risk. ? Your parents were born in a high-risk country, and you have not been immunized against hepatitis B (hepatitis B vaccine). ? You have HIV or AIDS. ? You use needles to inject street drugs. ? You live with someone who has hepatitis B. ? You have had sex with someone who has hepatitis B. ? You get hemodialysis treatment. ? You take certain medicines for conditions, including cancer, organ transplantation, and autoimmune conditions. Hepatitis C  Blood testing is recommended for: ? Everyone born from [birth year] through 1967. ? Anyone with known  risk factors for hepatitis C. Sexually transmitted infections (STIs)  You should be screened for sexually transmitted infections (STIs) including gonorrhea and chlamydia if: ? You are sexually active and are younger than 60 years of age. ? You  are older than 60 years of age and your health care provider tells you that you are at risk for this type of infection. ? Your sexual activity has changed since you were last screened and you are at an increased risk for chlamydia or gonorrhea. Ask your health care provider if you are at risk.  If you do not have HIV, but are at risk, it may be recommended that you take a prescription medicine daily to prevent HIV infection. This is called pre-exposure prophylaxis (PrEP). You are considered at risk if: ? You are sexually active and do not regularly use condoms or know the HIV status of your partner(s). ? You take drugs by injection. ? You are sexually active with a partner who has HIV. Talk with your health care provider about whether you are at high risk of being infected with HIV. If you choose to begin PrEP, you should first be tested for HIV. You should then be tested every 3 months for as long as you are taking PrEP. Pregnancy  If you are premenopausal and you may become pregnant, ask your health care provider about preconception counseling.  If you may become pregnant, take 400 to 800 micrograms (mcg) of folic acid every day.  If you want to prevent pregnancy, talk to your health care provider about birth control (contraception). Osteoporosis and menopause  Osteoporosis is a disease in which the bones lose minerals and strength with aging. This can result in serious bone fractures. Your risk for osteoporosis can be identified using a bone density scan.  If you are 83 years of age or older, or if you are at risk for osteoporosis and fractures, ask your health care provider if you should be screened.  Ask your health care provider whether you  should take a calcium or vitamin D supplement to lower your risk for osteoporosis.  Menopause may have certain physical symptoms and risks.  Hormone replacement therapy may reduce some of these symptoms and risks. Talk to your health care provider about whether hormone replacement therapy is right for you. Follow these instructions at home:  Schedule regular health, dental, and eye exams.  Stay current with your immunizations.  Do not use any tobacco products including cigarettes, chewing tobacco, or electronic cigarettes.  If you are pregnant, do not drink alcohol.  If you are breastfeeding, limit how much and how often you drink alcohol.  Limit alcohol intake to no more than 1 drink per day for nonpregnant women. One drink equals 12 ounces of beer, 5 ounces of wine, or 1 ounces of hard liquor.  Do not use street drugs.  Do not share needles.  Ask your health care provider for help if you need support or information about quitting drugs.  Tell your health care provider if you often feel depressed.  Tell your health care provider if you have ever been abused or do not feel safe at home. This information is not intended to replace advice given to you by your health care provider. Make sure you discuss any questions you have with your health care provider. Document Released: 03/08/2011 Document Revised: 01/29/2016 Document Reviewed: 05/27/2015 Elsevier Interactive Patient Education  2019 [employer].

## 2019-02-22 NOTE — Assessment & Plan Note (Signed)
Mild intermittent Well controlled right now with SABA only, she adds maintenance inhalers with seasonal allergies Does not need refills

## 2019-02-22 NOTE — Assessment & Plan Note (Signed)
Compliant with medication, no adverse side effects Labs reviewed today, significantly improved from prior labs Discussed continued healthy lifestyle Briefly discussed patient wanting to decrease the dose however I did review his Lipitor dosing and recommendations based off of risk for moderate intensity statin versus high intensity statin and since she has no adverse side effects did decide to continue treatment as is Follow-up 1 year with PCP (CMP done today, inadvertently omitted on lab work done prior to appointment)

## 2019-02-22 NOTE — Assessment & Plan Note (Signed)
Longstanding, has seen vascular in the past, not bothersome only unsightly- per pt No evidence of LE skin changes of vascular insufficiency Advised low-salt diet, continued exercise, compression stockings as needed

## 2019-02-22 NOTE — Progress Notes (Signed)
Patient: Paula Beasley, Female    DOB: Mar 06, 1961, 58 y.o.   MRN: [id number] Visit Date: 02/22/2019  Today's Provider: Delsa Grana, PA-C   Chief Complaint  Patient presents with  . CPE    is not fasting- defers PAP   Subjective:    Annual physical exam Paula Beasley is a 58 y.o. female who presents today for health maintenance and complete physical. She feels well. She reports exercising regularly. She reports she is sleeping fairly well - for 12 years has had hot flashes at night, so she rarely sleeps through the night.  -----------------------------------------------------------------  Some aches in joints here and there, knees and left hip/bursitis, going to a chiropractor and doing some supplements.  She has hx of varicosities and reticular veins, has previously seen vascular specialist, she has had no change or worsening no dependent edema, hyperpigmentation, wounds or sores to her lower extremities  She has noticed some very gradual onset of urinary incontinence with coughing or sneezing, and is a daily occurrence to have some incontinence.  She is also noticed some decrease rectal tone, she feels that everything in her perineum is very loose she cannot squeeze or hold anything in like she used to.  She has deferred PAPs for some time, has not seen GYN in years, does not really wish to at this time.  Continues to defer at this time.    Asthma - mild intermittent and worse with different seasons, where she will require a maintenance inhaler for short period of time and then can stop and again resume only SABA.  Rarely using albuterol inhaler right now, no recent asthma exacerbations.  She reports family hx of breast CA ( in mother) also family hx of thyroid disease, would like to screen thyroid.  She has been working on healthy lifestyle, diet and exercise and is trying to lose the last 6 pounds so she feels good.  She has no unintentional weight changes, no hair or skin changes.   No other health concerns  Cholesterol -did print and review her labs with her today her cholesterol levels are significantly improved from her last labs, she is taking Lipitor 20 mg at night with excellent compliance she denies any myalgias or fatigue.  She does ask if she is on to stronger dose and inquires about decreasing it.  Refuses HIV screen, Hep C screen, Tdap booster, PAP She is due for repeat mammogram Colonscopy recently done  Review of Systems  Constitutional: Negative.   HENT: Negative.   Eyes: Negative.   Respiratory: Negative.   Cardiovascular: Negative.   Gastrointestinal: Negative.   Endocrine: Negative.   Genitourinary: Negative.   Musculoskeletal: Negative.   Skin: Negative.   Allergic/Immunologic: Negative.   Neurological: Negative.   Hematological: Negative.   Psychiatric/Behavioral: Negative.   All other systems reviewed and are negative.   Social History      She  reports that she has never smoked. She has never used smokeless tobacco. She reports current alcohol use of about 4.0 standard drinks of alcohol per week. She reports that she does not use drugs.       Social History   Socioeconomic History  . Marital status: Married    Spouse name: Not on file  . Number of children: 2  . Years of education: Not on file  . Highest education level: Not on file  Occupational History  . Occupation: Self-Empolyed  Social Needs  . Financial resource strain: Not on file  . Food  insecurity    Worry: Not on file    Inability: Not on file  . Transportation needs    Medical: Not on file    Non-medical: Not on file  Tobacco Use  . Smoking status: Never Smoker  . Smokeless tobacco: Never Used  Substance and Sexual Activity  . Alcohol use: Yes    Alcohol/week: 4.0 standard drinks    Types: 2 Glasses of wine, 2 Cans of beer per week  . Drug use: No  . Sexual activity: Yes    Birth control/protection: Post-menopausal  Lifestyle  . Physical activity    Days  per week: Not on file    Minutes per session: Not on file  . Stress: Not on file  Relationships  . Social Herbalist on phone: Not on file    Gets together: Not on file    Attends religious service: Not on file    Active member of club or organization: Not on file    Attends meetings of clubs or organizations: Not on file    Relationship status: Not on file  Other Topics Concern  . Not on file  Social History Narrative  . Not on file    Past Medical History:  Diagnosis Date  . Allergy   . Asthma   . Cancer ([place])    skin  . Hyperlipidemia      Patient Active Problem List   Diagnosis Date Noted  . Allergy   . Asthma   . History of SCC (squamous cell carcinoma) of skin   . Hyperlipidemia     Past Surgical History:  Procedure Laterality Date  . APPENDECTOMY     58yo    Family History        Family Status  Relation Name Status  . Mother  Deceased at age 42  . Father  Alive  . Sister  Deceased at age 70 months  . Brother  Deceased at age 64 days  . MGM  Deceased  . MGF  Deceased  . PGM  Deceased  . PGF  Deceased        Her family history includes Asthma in her father; Breast cancer in her mother; Cancer in her father and mother; Early death in her brother and sister; Heart disease in her father; Hyperlipidemia in her father and mother.      Allergies  Allergen Reactions  . Penicillins Rash     Current Outpatient Medications:  .  atorvastatin (LIPITOR) 20 MG tablet, TAKE 1 TABLET(20 MG) BY MOUTH DAILY, Disp: 90 tablet, Rfl: 3 .  budesonide-formoterol (SYMBICORT) 160-4.5 MCG/ACT inhaler, Inhale 2 puffs into the lungs 2 (two) times daily. (Patient not taking: Reported on 02/22/2019), Disp: 1 Inhaler, Rfl: 3 .  cetirizine (ZYRTEC) 10 MG tablet, Take 10 mg by mouth daily as needed. , Disp: , Rfl:  .  diphenhydrAMINE (BENADRYL) 25 MG tablet, Take 25 mg by mouth every 6 (six) hours as needed., Disp: , Rfl:  .  diphenoxylate-atropine (LOMOTIL) 2.5-0.025 MG  tablet, TAKE 1 TO 2 TABLETS BY MOUTH FOUR TIMES DAILY AS NEEDED (Patient not taking: Reported on 02/22/2019), Disp: 20 tablet, Rfl: 1 .  fluticasone (FLONASE) 50 MCG/ACT nasal spray, Place 2 sprays into both nostrils daily. (Patient not taking: Reported on 02/22/2019), Disp: 16 g, Rfl: 11 .  VENTOLIN HFA 108 (90 Base) MCG/ACT inhaler, INHALE 2 PUFFS INTO THE LUNGS EVERY 6 HOURS AS NEEDED FOR WHEEZING OR SHORTNESS OF BREATH (Patient not taking:  Reported on 02/22/2019), Disp: 18 g, Rfl: 0  Current Facility-Administered Medications:  .  0.9 %  sodium chloride infusion, 500 mL, Intravenous, Once, Milus Banister, MD   Patient Care Team: Alycia Rossetti, MD as PCP - General (Family Medicine)      Objective:   Vitals: BP 112/66   Pulse 70   Temp 98.1 F (36.7 C) (Oral)   Resp 14   Ht 5' 4"  (1.626 m)   Wt 156 lb (70.8 kg)   SpO2 98%   BMI 26.78 kg/m    Vitals:   02/22/19 1407  BP: 112/66  Pulse: 70  Resp: 14  Temp: 98.1 F (36.7 C)  TempSrc: Oral  SpO2: 98%  Weight: 156 lb (70.8 kg)  Height: 5' 4"  (1.626 m)  Body mass index is 26.78 kg/m.    Physical Exam Vitals signs and nursing note reviewed.  Constitutional:      General: She is not in acute distress.    Appearance: Normal appearance. She is well-developed and normal weight. She is not ill-appearing, toxic-appearing or diaphoretic.     Comments: Well-appearing female, appears stated age  HENT:     Head: Normocephalic and atraumatic.     Right Ear: External ear normal.     Left Ear: External ear normal.     Nose: Nose normal. No congestion or rhinorrhea.     Mouth/Throat:     Mouth: Mucous membranes are moist.     Pharynx: Oropharynx is clear. Uvula midline. No oropharyngeal exudate or posterior oropharyngeal erythema.  Eyes:     General: Lids are normal. No scleral icterus.    Conjunctiva/sclera: Conjunctivae normal.     Pupils: Pupils are equal, round, and reactive to light.  Neck:     Musculoskeletal: Normal  range of motion and neck supple.     Thyroid: No thyromegaly or thyroid tenderness.     Trachea: Phonation normal. No tracheal deviation.  Cardiovascular:     Rate and Rhythm: Normal rate and regular rhythm.     Pulses: Normal pulses.          Radial pulses are 2+ on the right side and 2+ on the left side.       Posterior tibial pulses are 2+ on the right side and 2+ on the left side.     Heart sounds: Normal heart sounds. No murmur. No friction rub. No gallop.   Pulmonary:     Effort: Pulmonary effort is normal. No respiratory distress.     Breath sounds: Normal breath sounds. No stridor. No wheezing, rhonchi or rales.  Chest:     Chest wall: No tenderness.  Abdominal:     General: Bowel sounds are normal. There is no distension.     Palpations: Abdomen is soft.     Tenderness: There is no abdominal tenderness.  Musculoskeletal: Normal range of motion.        General: No deformity.  Lymphadenopathy:     Cervical: No cervical adenopathy.  Skin:    General: Skin is warm and dry.     Capillary Refill: Capillary refill takes less than 2 seconds.     Coloration: Skin is not jaundiced or pale.     Findings: No bruising or rash.     Comments: Scattered varicosities and reticular veins to bilateral lower extremities, no pitting edema, no hyperpigmentation no rashes, no ulcers  Neurological:     Mental Status: She is alert.     Motor: No abnormal muscle  tone.     Coordination: Coordination normal.     Gait: Gait normal.  Psychiatric:        Mood and Affect: Mood normal.        Speech: Speech normal.        Behavior: Behavior normal.        Thought Content: Thought content normal.        Judgment: Judgment normal.      Depression Screen PHQ 2/9 Scores 02/22/2019 11/29/2017 09/21/2017 06/25/2015  PHQ - 2 Score 0 0 0 0  PHQ- 9 Score - - - 0     Office Visit from 02/22/2019 in [facility]  AUDIT-C Score  1     Results for orders placed or performed in visit on  02/20/19  Lipid panel  Result Value Ref Range   Cholesterol 144 <200 mg/dL   HDL 51 > OR = 50 mg/dL   Triglycerides 67 <150 mg/dL   LDL Cholesterol (Calc) 79 mg/dL (calc)   Total CHOL/HDL Ratio 2.8 <5.0 (calc)   Non-HDL Cholesterol (Calc) 93 <130 mg/dL (calc)   The 10-year ASCVD risk score Mikey Bussing DC Jr., et al., 2013) is: 2.1%   Values used to calculate the score:     Age: 6 years     Sex: Female     Is Non-Hispanic African American: No     Diabetic: No     Tobacco smoker: No     Systolic Blood Pressure: 094 mmHg     Is BP treated: No     HDL Cholesterol: 51 mg/dL     Total Cholesterol: 144 mg/dL     Assessment & Plan:     Routine Health Maintenance and Physical Exam  Exercise Activities and Dietary recommendations Goals    . Weight (lb) < 200 lb (90.7 kg)     She wants to loose 6 more lbs      Discussed health benefits of physical activity, and encouraged her to engage in regular exercise appropriate for her age and condition.    There is no immunization history on file for this patient.  Health Maintenance  Topic Date Due  . MAMMOGRAM  10/04/2018  . PAP SMEAR-Modifier  02/22/2019 (Originally 01/26/1980)  . TETANUS/TDAP  02/22/2020 (Originally 01/25/1978)  . Hepatitis C Screening  02/22/2020 (Originally 01-08-1961)  . HIV Screening  02/22/2020 (Originally 01/25/1974)  . INFLUENZA VACCINE  04/07/2019  . COLONOSCOPY  11/15/2027     12 years ago menopause - advised Vit D and Calcium supplementation  Problem List Items Addressed This Visit      Cardiovascular and Mediastinum   Varicosities of leg    Longstanding, has seen vascular in the past, not bothersome only unsightly- per pt No evidence of LE skin changes of vascular insufficiency Advised low-salt diet, continued exercise, compression stockings as needed        Respiratory   Asthma    Mild intermittent Well controlled right now with SABA only, she adds maintenance inhalers with seasonal allergies  Does not need refills        Musculoskeletal and Integument   Pelvic floor dysfunction in female    Discussed options to work up and to start treating Offered GYN referral to one with additional specialization in pelvic floor dysfunction.  Pt declined and does not want to at this time, "maybe I will if it gets worse"        Other   Hyperlipidemia    Compliant with medication, no  adverse side effects Labs reviewed today, significantly improved from prior labs Discussed continued healthy lifestyle Briefly discussed patient wanting to decrease the dose however I did review his Lipitor dosing and recommendations based off of risk for moderate intensity statin versus high intensity statin and since she has no adverse side effects did decide to continue treatment as is Follow-up 1 year with PCP (CMP done today, inadvertently omitted on lab work done prior to appointment)       Other Visit Diagnoses    Routine general medical examination at a health care facility    -  Primary   Relevant Orders   CBC with Differential/Platelet   COMPLETE METABOLIC PANEL WITH GFR   TSH   Screening for malignant neoplasm of breast       Relevant Orders   MM Digital Screening       Delsa Grana, PA-C 02/22/19 2:56 PM  [facility]

## 2019-02-22 NOTE — Assessment & Plan Note (Signed)
Discussed options to work up and to start treating Offered GYN referral to one with additional specialization in pelvic floor dysfunction.  Pt declined and does not want to at this time, "maybe I will if it gets worse"

## 2019-02-23 LAB — CBC WITH DIFFERENTIAL/PLATELET
Absolute Monocytes: 501 cells/uL (ref 200–950)
Basophils Absolute: 39 cells/uL (ref 0–200)
Basophils Relative: 0.6 %
Eosinophils Absolute: 163 cells/uL (ref 15–500)
Eosinophils Relative: 2.5 %
HCT: 39.6 % (ref 35.0–45.0)
Hemoglobin: 14 g/dL (ref 11.7–15.5)
Lymphs Abs: 1528 cells/uL (ref 850–3900)
MCH: 30.8 pg (ref 27.0–33.0)
MCHC: 35.4 g/dL (ref 32.0–36.0)
MCV: 87 fL (ref 80.0–100.0)
MPV: 12.3 fL (ref 7.5–12.5)
Monocytes Relative: 7.7 %
Neutro Abs: 4271 cells/uL (ref 1500–7800)
Neutrophils Relative %: 65.7 %
Platelets: 216 10*3/uL (ref 140–400)
RBC: 4.55 10*6/uL (ref 3.80–5.10)
RDW: 11.8 % (ref 11.0–15.0)
Total Lymphocyte: 23.5 %
WBC: 6.5 10*3/uL (ref 3.8–10.8)

## 2019-02-23 LAB — COMPLETE METABOLIC PANEL WITH GFR
AG Ratio: 2.1 (calc) (ref 1.0–2.5)
ALT: 14 U/L (ref 6–29)
AST: 19 U/L (ref 10–35)
Albumin: 4.4 g/dL (ref 3.6–5.1)
Alkaline phosphatase (APISO): 76 U/L (ref 37–153)
BUN: 25 mg/dL (ref 7–25)
CO2: 25 mmol/L (ref 20–32)
Calcium: 9.3 mg/dL (ref 8.6–10.4)
Chloride: 103 mmol/L (ref 98–110)
Creat: 0.92 mg/dL (ref 0.50–0.99)
GFR, Est African American: 78 mL/min/{1.73_m2} (ref >=60)
GFR, Est Non African American: 68 mL/min/{1.73_m2} (ref >=60)
Globulin: 2.1 g/dL (calc) (ref 1.9–3.7)
Glucose, Bld: 107 mg/dL — ABNORMAL HIGH (ref 65–99)
Potassium: 4.5 mmol/L (ref 3.5–5.3)
Sodium: 138 mmol/L (ref 135–146)
Total Bilirubin: 0.6 mg/dL (ref 0.2–1.2)
Total Protein: 6.5 g/dL (ref 6.1–8.1)

## 2019-02-23 LAB — TSH: TSH: 1.97 mIU/L (ref 0.40–4.50)

## 2019-02-27 ENCOUNTER — Encounter: Payer: Self-pay | Admitting: Family Medicine

## 2019-02-27 DIAGNOSIS — M62830 Muscle spasm of back: Secondary | ICD-10-CM | POA: Diagnosis not present

## 2019-02-27 DIAGNOSIS — M9902 Segmental and somatic dysfunction of thoracic region: Secondary | ICD-10-CM | POA: Diagnosis not present

## 2019-02-27 DIAGNOSIS — M9901 Segmental and somatic dysfunction of cervical region: Secondary | ICD-10-CM | POA: Diagnosis not present

## 2019-02-27 DIAGNOSIS — M542 Cervicalgia: Secondary | ICD-10-CM | POA: Diagnosis not present

## 2019-03-01 DIAGNOSIS — M62830 Muscle spasm of back: Secondary | ICD-10-CM | POA: Diagnosis not present

## 2019-03-01 DIAGNOSIS — M9901 Segmental and somatic dysfunction of cervical region: Secondary | ICD-10-CM | POA: Diagnosis not present

## 2019-03-01 DIAGNOSIS — M9902 Segmental and somatic dysfunction of thoracic region: Secondary | ICD-10-CM | POA: Diagnosis not present

## 2019-03-01 DIAGNOSIS — M542 Cervicalgia: Secondary | ICD-10-CM | POA: Diagnosis not present

## 2019-03-16 ENCOUNTER — Encounter: Payer: Self-pay | Admitting: Family Medicine

## 2019-03-16 DIAGNOSIS — M25561 Pain in right knee: Secondary | ICD-10-CM

## 2019-03-16 DIAGNOSIS — M25562 Pain in left knee: Secondary | ICD-10-CM

## 2019-03-16 DIAGNOSIS — R600 Localized edema: Secondary | ICD-10-CM

## 2019-03-19 DIAGNOSIS — M62830 Muscle spasm of back: Secondary | ICD-10-CM | POA: Diagnosis not present

## 2019-03-19 DIAGNOSIS — M9902 Segmental and somatic dysfunction of thoracic region: Secondary | ICD-10-CM | POA: Diagnosis not present

## 2019-03-19 DIAGNOSIS — M9901 Segmental and somatic dysfunction of cervical region: Secondary | ICD-10-CM | POA: Diagnosis not present

## 2019-03-19 DIAGNOSIS — M542 Cervicalgia: Secondary | ICD-10-CM | POA: Diagnosis not present

## 2019-03-20 ENCOUNTER — Other Ambulatory Visit: Payer: Self-pay

## 2019-03-20 ENCOUNTER — Ambulatory Visit (INDEPENDENT_AMBULATORY_CARE_PROVIDER_SITE_OTHER): Payer: Self-pay

## 2019-03-20 ENCOUNTER — Ambulatory Visit: Payer: Self-pay

## 2019-03-20 ENCOUNTER — Encounter: Payer: Self-pay | Admitting: Orthopaedic Surgery

## 2019-03-20 ENCOUNTER — Ambulatory Visit (INDEPENDENT_AMBULATORY_CARE_PROVIDER_SITE_OTHER): Payer: BLUE CROSS/BLUE SHIELD | Admitting: Orthopaedic Surgery

## 2019-03-20 DIAGNOSIS — M25552 Pain in left hip: Secondary | ICD-10-CM

## 2019-03-20 DIAGNOSIS — M25561 Pain in right knee: Secondary | ICD-10-CM

## 2019-03-20 DIAGNOSIS — M25562 Pain in left knee: Secondary | ICD-10-CM

## 2019-03-20 NOTE — Progress Notes (Signed)
Office Visit Note   Patient: Paula Beasley           Date of Birth: [birth year]           MRN: [id number] Visit Date: 03/20/2019              Requested by: Alycia Rossetti, MD [address] [place],  [place] [address] PCP: Alycia Rossetti, MD   Assessment & Plan: Visit Diagnoses:  1. Pain in both knees, unspecified chronicity   2. Pain of left hip joint     Plan: Impression is bilateral knee osteoarthritis and left hip trochanteric bursitis and possible abductor tendinosis.  She would like to do formal outpatient physical therapy for these conditions.  Patient's pain is not severe enough to warrant an injection at this point..  Patient will try Voltaren gel as well as continue with over-the-counter NSAIDs.  Questions encouraged and answered.  Follow-up as needed.  Follow-Up Instructions: Return if symptoms worsen or fail to improve.   Orders:  Orders Placed This Encounter  Procedures  . XR Knee Complete 4 Views Right  . XR Knee Complete 4 Views Left   No orders of the defined types were placed in this encounter.     Procedures: No procedures performed   Clinical Data: No additional findings.   Subjective: Chief Complaint  Patient presents with  . Right Knee - Pain  . Left Knee - Pain    [name] is a pleasant 58 year old female who comes in with bilateral knee pain slightly worse on the right for about a month.  Denies any injuries.  She does notice weakness and stiffness and swelling with some giving way.  Denies any mechanical symptoms.  Denies any definite injuries.  She has been seeing a chiropractor for her back pain.  She is also endorsing some lateral left hip pain without radiation.  She has recently been laid off due to the COVID crisis.  She states that she does do a lot of walking occasionally the knee will swell.  She has been taking over-the-counter NSAIDs.   Review of Systems  Constitutional: Negative.   HENT: Negative.   Eyes: Negative.    Respiratory: Negative.   Cardiovascular: Negative.   Endocrine: Negative.   Musculoskeletal: Negative.   Neurological: Negative.   Hematological: Negative.   Psychiatric/Behavioral: Negative.   All other systems reviewed and are negative.    Objective: Vital Signs: There were no vitals taken for this visit.  Physical Exam Vitals signs and nursing note reviewed.  Constitutional:      Appearance: She is well-developed.  HENT:     Head: Normocephalic and atraumatic.  Neck:     Musculoskeletal: Neck supple.  Pulmonary:     Effort: Pulmonary effort is normal.  Abdominal:     Palpations: Abdomen is soft.  Skin:    General: Skin is warm.     Capillary Refill: Capillary refill takes less than 2 seconds.  Neurological:     Mental Status: She is alert and oriented to person, place, and time.  Psychiatric:        Behavior: Behavior normal.        Thought Content: Thought content normal.        Judgment: Judgment normal.     Ortho Exam Bilateral knee exam shows no joint effusion.  Normal range of motion.  2+ patellofemoral crepitus.  Collaterals and cruciates are stable.  Left hip exam shows tenderness of the greater trochanter.  Mild pain with resisted hip abduction. Specialty Comments:  No specialty comments available.  Imaging: Xr Knee Complete 4 Views Left  Result Date: 03/20/2019 Mild osteoarthritis  Xr Knee Complete 4 Views Right  Result Date: 03/20/2019 Mild osteoarthritic changes worse in the right knee.    PMFS History: Patient Active Problem List   Diagnosis Date Noted  . Pain in both knees 03/20/2019  . Pain of left hip joint 03/20/2019  . Pelvic floor dysfunction in female 02/22/2019  . Varicosities of leg 02/22/2019  . Allergy   . Asthma   . History of SCC (squamous cell carcinoma) of skin   . Hyperlipidemia    Past Medical History:  Diagnosis Date  . Allergy   . Asthma   . Cancer ([place])    skin  . Hyperlipidemia     Family History  Problem  Relation Age of Onset  . Hyperlipidemia Mother   . Cancer Mother        metatastic breast cancer  . Breast cancer Mother   . Asthma Father   . Hyperlipidemia Father   . Heart disease Father   . Cancer Father        Prostate  . Early death Sister        after birth   . Early death Brother        after birth     Past Surgical History:  Procedure Laterality Date  . APPENDECTOMY     58yo   Social History   Occupational History  . Occupation: Self-Empolyed  Tobacco Use  . Smoking status: Never Smoker  . Smokeless tobacco: Never Used  Substance and Sexual Activity  . Alcohol use: Yes    Alcohol/week: 4.0 standard drinks    Types: 2 Glasses of wine, 2 Cans of beer per week  . Drug use: No  . Sexual activity: Yes    Birth control/protection: Post-menopausal

## 2019-03-21 DIAGNOSIS — M9901 Segmental and somatic dysfunction of cervical region: Secondary | ICD-10-CM | POA: Diagnosis not present

## 2019-03-21 DIAGNOSIS — M62830 Muscle spasm of back: Secondary | ICD-10-CM | POA: Diagnosis not present

## 2019-03-21 DIAGNOSIS — M9902 Segmental and somatic dysfunction of thoracic region: Secondary | ICD-10-CM | POA: Diagnosis not present

## 2019-03-21 DIAGNOSIS — M542 Cervicalgia: Secondary | ICD-10-CM | POA: Diagnosis not present

## 2019-03-22 DIAGNOSIS — M9902 Segmental and somatic dysfunction of thoracic region: Secondary | ICD-10-CM | POA: Diagnosis not present

## 2019-03-22 DIAGNOSIS — M62830 Muscle spasm of back: Secondary | ICD-10-CM | POA: Diagnosis not present

## 2019-03-22 DIAGNOSIS — M9901 Segmental and somatic dysfunction of cervical region: Secondary | ICD-10-CM | POA: Diagnosis not present

## 2019-03-22 DIAGNOSIS — M542 Cervicalgia: Secondary | ICD-10-CM | POA: Diagnosis not present

## 2019-03-26 DIAGNOSIS — M542 Cervicalgia: Secondary | ICD-10-CM | POA: Diagnosis not present

## 2019-03-26 DIAGNOSIS — M62830 Muscle spasm of back: Secondary | ICD-10-CM | POA: Diagnosis not present

## 2019-03-26 DIAGNOSIS — M9902 Segmental and somatic dysfunction of thoracic region: Secondary | ICD-10-CM | POA: Diagnosis not present

## 2019-03-26 DIAGNOSIS — M9901 Segmental and somatic dysfunction of cervical region: Secondary | ICD-10-CM | POA: Diagnosis not present

## 2019-03-28 DIAGNOSIS — M542 Cervicalgia: Secondary | ICD-10-CM | POA: Diagnosis not present

## 2019-03-28 DIAGNOSIS — M9902 Segmental and somatic dysfunction of thoracic region: Secondary | ICD-10-CM | POA: Diagnosis not present

## 2019-03-28 DIAGNOSIS — M9901 Segmental and somatic dysfunction of cervical region: Secondary | ICD-10-CM | POA: Diagnosis not present

## 2019-03-28 DIAGNOSIS — M62830 Muscle spasm of back: Secondary | ICD-10-CM | POA: Diagnosis not present

## 2019-04-02 DIAGNOSIS — M542 Cervicalgia: Secondary | ICD-10-CM | POA: Diagnosis not present

## 2019-04-02 DIAGNOSIS — M9901 Segmental and somatic dysfunction of cervical region: Secondary | ICD-10-CM | POA: Diagnosis not present

## 2019-04-02 DIAGNOSIS — M62830 Muscle spasm of back: Secondary | ICD-10-CM | POA: Diagnosis not present

## 2019-04-02 DIAGNOSIS — M9902 Segmental and somatic dysfunction of thoracic region: Secondary | ICD-10-CM | POA: Diagnosis not present

## 2019-04-05 DIAGNOSIS — M9902 Segmental and somatic dysfunction of thoracic region: Secondary | ICD-10-CM | POA: Diagnosis not present

## 2019-04-05 DIAGNOSIS — M9901 Segmental and somatic dysfunction of cervical region: Secondary | ICD-10-CM | POA: Diagnosis not present

## 2019-04-05 DIAGNOSIS — M542 Cervicalgia: Secondary | ICD-10-CM | POA: Diagnosis not present

## 2019-04-05 DIAGNOSIS — M62830 Muscle spasm of back: Secondary | ICD-10-CM | POA: Diagnosis not present

## 2019-04-18 ENCOUNTER — Ambulatory Visit: Payer: BLUE CROSS/BLUE SHIELD

## 2019-04-24 DIAGNOSIS — M9901 Segmental and somatic dysfunction of cervical region: Secondary | ICD-10-CM | POA: Diagnosis not present

## 2019-04-24 DIAGNOSIS — M62830 Muscle spasm of back: Secondary | ICD-10-CM | POA: Diagnosis not present

## 2019-04-24 DIAGNOSIS — M542 Cervicalgia: Secondary | ICD-10-CM | POA: Diagnosis not present

## 2019-04-24 DIAGNOSIS — M9902 Segmental and somatic dysfunction of thoracic region: Secondary | ICD-10-CM | POA: Diagnosis not present

## 2019-04-25 DIAGNOSIS — M62830 Muscle spasm of back: Secondary | ICD-10-CM | POA: Diagnosis not present

## 2019-04-25 DIAGNOSIS — M542 Cervicalgia: Secondary | ICD-10-CM | POA: Diagnosis not present

## 2019-04-25 DIAGNOSIS — M9902 Segmental and somatic dysfunction of thoracic region: Secondary | ICD-10-CM | POA: Diagnosis not present

## 2019-04-25 DIAGNOSIS — M9901 Segmental and somatic dysfunction of cervical region: Secondary | ICD-10-CM | POA: Diagnosis not present

## 2019-04-30 DIAGNOSIS — M9902 Segmental and somatic dysfunction of thoracic region: Secondary | ICD-10-CM | POA: Diagnosis not present

## 2019-04-30 DIAGNOSIS — M9901 Segmental and somatic dysfunction of cervical region: Secondary | ICD-10-CM | POA: Diagnosis not present

## 2019-04-30 DIAGNOSIS — M542 Cervicalgia: Secondary | ICD-10-CM | POA: Diagnosis not present

## 2019-04-30 DIAGNOSIS — M62830 Muscle spasm of back: Secondary | ICD-10-CM | POA: Diagnosis not present

## 2019-05-03 DIAGNOSIS — M542 Cervicalgia: Secondary | ICD-10-CM | POA: Diagnosis not present

## 2019-05-03 DIAGNOSIS — M62830 Muscle spasm of back: Secondary | ICD-10-CM | POA: Diagnosis not present

## 2019-05-03 DIAGNOSIS — M9902 Segmental and somatic dysfunction of thoracic region: Secondary | ICD-10-CM | POA: Diagnosis not present

## 2019-05-03 DIAGNOSIS — M9901 Segmental and somatic dysfunction of cervical region: Secondary | ICD-10-CM | POA: Diagnosis not present

## 2019-05-07 DIAGNOSIS — M62830 Muscle spasm of back: Secondary | ICD-10-CM | POA: Diagnosis not present

## 2019-05-07 DIAGNOSIS — M9902 Segmental and somatic dysfunction of thoracic region: Secondary | ICD-10-CM | POA: Diagnosis not present

## 2019-05-07 DIAGNOSIS — M9901 Segmental and somatic dysfunction of cervical region: Secondary | ICD-10-CM | POA: Diagnosis not present

## 2019-05-07 DIAGNOSIS — M542 Cervicalgia: Secondary | ICD-10-CM | POA: Diagnosis not present

## 2019-05-28 ENCOUNTER — Ambulatory Visit
Admission: RE | Admit: 2019-05-28 | Discharge: 2019-05-28 | Disposition: A | Discharge status: Home / Self Care | Payer: BC Managed Care – PPO | Source: Ambulatory Visit | Attending: Family Medicine | Admitting: Family Medicine

## 2019-05-28 ENCOUNTER — Other Ambulatory Visit: Payer: Self-pay

## 2019-05-28 DIAGNOSIS — Z1231 Encounter for screening mammogram for malignant neoplasm of breast: Secondary | ICD-10-CM | POA: Diagnosis not present

## 2019-06-12 ENCOUNTER — Other Ambulatory Visit: Payer: Self-pay

## 2019-06-12 DIAGNOSIS — Z20822 Contact with and (suspected) exposure to covid-19: Secondary | ICD-10-CM

## 2019-06-12 DIAGNOSIS — Z20828 Contact with and (suspected) exposure to other viral communicable diseases: Secondary | ICD-10-CM | POA: Diagnosis not present

## 2019-06-14 LAB — NOVEL CORONAVIRUS, NAA: SARS-CoV-2, NAA: NOT DETECTED

## 2019-07-03 ENCOUNTER — Encounter: Payer: Self-pay | Admitting: Family Medicine

## 2019-07-04 MED ORDER — TRAMADOL HCL 50 MG PO TABS: 50.0000 mg | ORAL_TABLET | Freq: Three times a day (TID) | ORAL | 0 refills | Status: AC | PRN

## 2019-07-06 DIAGNOSIS — M25562 Pain in left knee: Secondary | ICD-10-CM | POA: Diagnosis not present

## 2019-07-06 DIAGNOSIS — M545 Low back pain: Secondary | ICD-10-CM | POA: Diagnosis not present

## 2019-07-06 DIAGNOSIS — M25552 Pain in left hip: Secondary | ICD-10-CM | POA: Diagnosis not present

## 2019-07-06 DIAGNOSIS — M25561 Pain in right knee: Secondary | ICD-10-CM | POA: Diagnosis not present

## 2019-07-09 DIAGNOSIS — M545 Low back pain: Secondary | ICD-10-CM | POA: Diagnosis not present

## 2019-07-09 DIAGNOSIS — M25561 Pain in right knee: Secondary | ICD-10-CM | POA: Diagnosis not present

## 2019-07-09 DIAGNOSIS — M25562 Pain in left knee: Secondary | ICD-10-CM | POA: Diagnosis not present

## 2019-07-09 DIAGNOSIS — M25552 Pain in left hip: Secondary | ICD-10-CM | POA: Diagnosis not present

## 2019-07-11 DIAGNOSIS — M25561 Pain in right knee: Secondary | ICD-10-CM | POA: Diagnosis not present

## 2019-07-11 DIAGNOSIS — M545 Low back pain: Secondary | ICD-10-CM | POA: Diagnosis not present

## 2019-07-11 DIAGNOSIS — M25562 Pain in left knee: Secondary | ICD-10-CM | POA: Diagnosis not present

## 2019-07-11 DIAGNOSIS — M25552 Pain in left hip: Secondary | ICD-10-CM | POA: Diagnosis not present

## 2019-07-13 DIAGNOSIS — M25552 Pain in left hip: Secondary | ICD-10-CM | POA: Diagnosis not present

## 2019-07-13 DIAGNOSIS — M545 Low back pain: Secondary | ICD-10-CM | POA: Diagnosis not present

## 2019-07-13 DIAGNOSIS — M25562 Pain in left knee: Secondary | ICD-10-CM | POA: Diagnosis not present

## 2019-07-13 DIAGNOSIS — M25561 Pain in right knee: Secondary | ICD-10-CM | POA: Diagnosis not present

## 2019-07-17 DIAGNOSIS — M25562 Pain in left knee: Secondary | ICD-10-CM | POA: Diagnosis not present

## 2019-07-17 DIAGNOSIS — M25552 Pain in left hip: Secondary | ICD-10-CM | POA: Diagnosis not present

## 2019-07-17 DIAGNOSIS — M25561 Pain in right knee: Secondary | ICD-10-CM | POA: Diagnosis not present

## 2019-07-17 DIAGNOSIS — M545 Low back pain: Secondary | ICD-10-CM | POA: Diagnosis not present

## 2019-07-19 DIAGNOSIS — M25552 Pain in left hip: Secondary | ICD-10-CM | POA: Diagnosis not present

## 2019-07-19 DIAGNOSIS — M545 Low back pain: Secondary | ICD-10-CM | POA: Diagnosis not present

## 2019-07-19 DIAGNOSIS — M25562 Pain in left knee: Secondary | ICD-10-CM | POA: Diagnosis not present

## 2019-07-19 DIAGNOSIS — M25561 Pain in right knee: Secondary | ICD-10-CM | POA: Diagnosis not present

## 2019-07-24 DIAGNOSIS — M25552 Pain in left hip: Secondary | ICD-10-CM | POA: Diagnosis not present

## 2019-07-24 DIAGNOSIS — M545 Low back pain: Secondary | ICD-10-CM | POA: Diagnosis not present

## 2019-07-24 DIAGNOSIS — M25562 Pain in left knee: Secondary | ICD-10-CM | POA: Diagnosis not present

## 2019-07-24 DIAGNOSIS — M25561 Pain in right knee: Secondary | ICD-10-CM | POA: Diagnosis not present

## 2019-07-30 DIAGNOSIS — M25562 Pain in left knee: Secondary | ICD-10-CM | POA: Diagnosis not present

## 2019-07-30 DIAGNOSIS — M25552 Pain in left hip: Secondary | ICD-10-CM | POA: Diagnosis not present

## 2019-07-30 DIAGNOSIS — M545 Low back pain: Secondary | ICD-10-CM | POA: Diagnosis not present

## 2019-07-30 DIAGNOSIS — M25561 Pain in right knee: Secondary | ICD-10-CM | POA: Diagnosis not present

## 2019-08-08 DIAGNOSIS — M25552 Pain in left hip: Secondary | ICD-10-CM | POA: Diagnosis not present

## 2019-08-08 DIAGNOSIS — M545 Low back pain: Secondary | ICD-10-CM | POA: Diagnosis not present

## 2019-08-08 DIAGNOSIS — M25561 Pain in right knee: Secondary | ICD-10-CM | POA: Diagnosis not present

## 2019-08-08 DIAGNOSIS — M25562 Pain in left knee: Secondary | ICD-10-CM | POA: Diagnosis not present

## 2019-08-16 DIAGNOSIS — M25552 Pain in left hip: Secondary | ICD-10-CM | POA: Diagnosis not present

## 2019-08-16 DIAGNOSIS — M25561 Pain in right knee: Secondary | ICD-10-CM | POA: Diagnosis not present

## 2019-08-16 DIAGNOSIS — M25562 Pain in left knee: Secondary | ICD-10-CM | POA: Diagnosis not present

## 2019-08-16 DIAGNOSIS — M545 Low back pain: Secondary | ICD-10-CM | POA: Diagnosis not present

## 2019-08-24 DIAGNOSIS — M545 Low back pain: Secondary | ICD-10-CM | POA: Diagnosis not present

## 2019-08-24 DIAGNOSIS — M25552 Pain in left hip: Secondary | ICD-10-CM | POA: Diagnosis not present

## 2019-08-24 DIAGNOSIS — M25561 Pain in right knee: Secondary | ICD-10-CM | POA: Diagnosis not present

## 2019-08-24 DIAGNOSIS — M25562 Pain in left knee: Secondary | ICD-10-CM | POA: Diagnosis not present

## 2019-09-05 ENCOUNTER — Encounter: Payer: Self-pay | Admitting: Family Medicine

## 2019-12-03 ENCOUNTER — Other Ambulatory Visit: Payer: Self-pay | Admitting: Family Medicine

## 2019-12-05 DIAGNOSIS — M6281 Muscle weakness (generalized): Secondary | ICD-10-CM | POA: Diagnosis not present

## 2019-12-05 DIAGNOSIS — M545 Low back pain: Secondary | ICD-10-CM | POA: Diagnosis not present

## 2019-12-05 DIAGNOSIS — M2560 Stiffness of unspecified joint, not elsewhere classified: Secondary | ICD-10-CM | POA: Diagnosis not present

## 2019-12-05 DIAGNOSIS — M5416 Radiculopathy, lumbar region: Secondary | ICD-10-CM | POA: Diagnosis not present

## 2019-12-29 ENCOUNTER — Other Ambulatory Visit: Payer: Self-pay | Admitting: Family Medicine

## 2020-02-16 IMAGING — MG MM DIGITAL SCREENING BILAT W/ TOMO W/ CAD
8 series · 8 of 24 positions shown · non-contrast
Comparison: Previous exam(s).

CLINICAL DATA: Screening.

EXAM:
DIGITAL SCREENING BILATERAL MAMMOGRAM WITH TOMO AND CAD

[L MLO synth-2D]
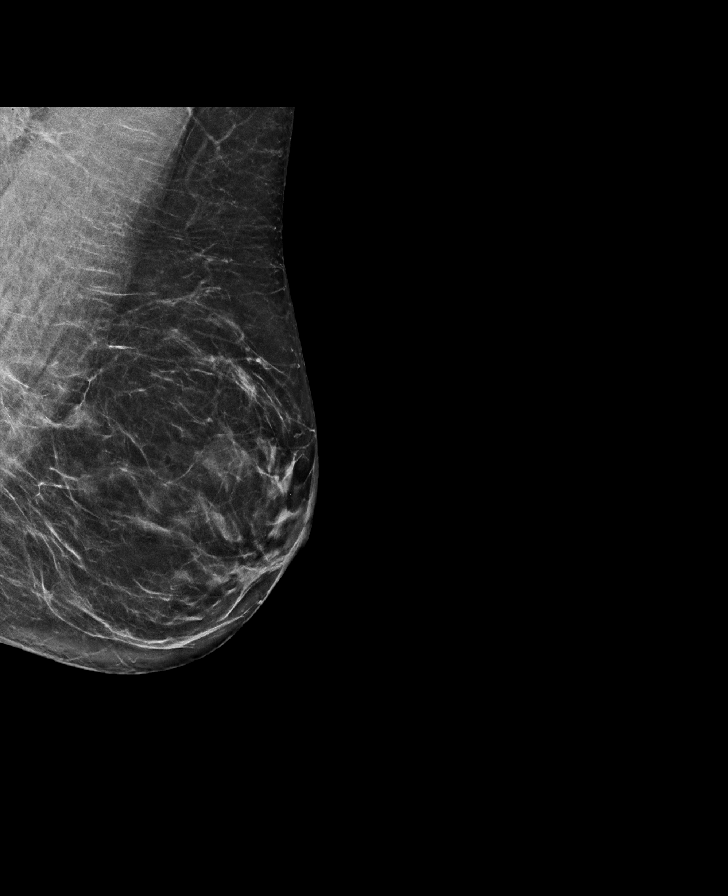

[R MLO synth-2D]
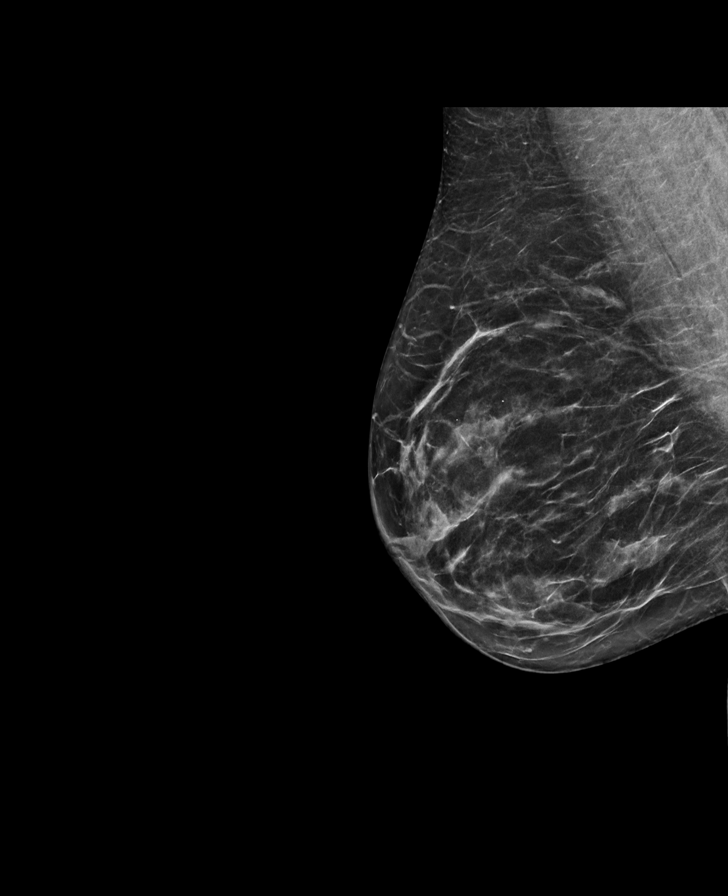

[R CC synth-2D]
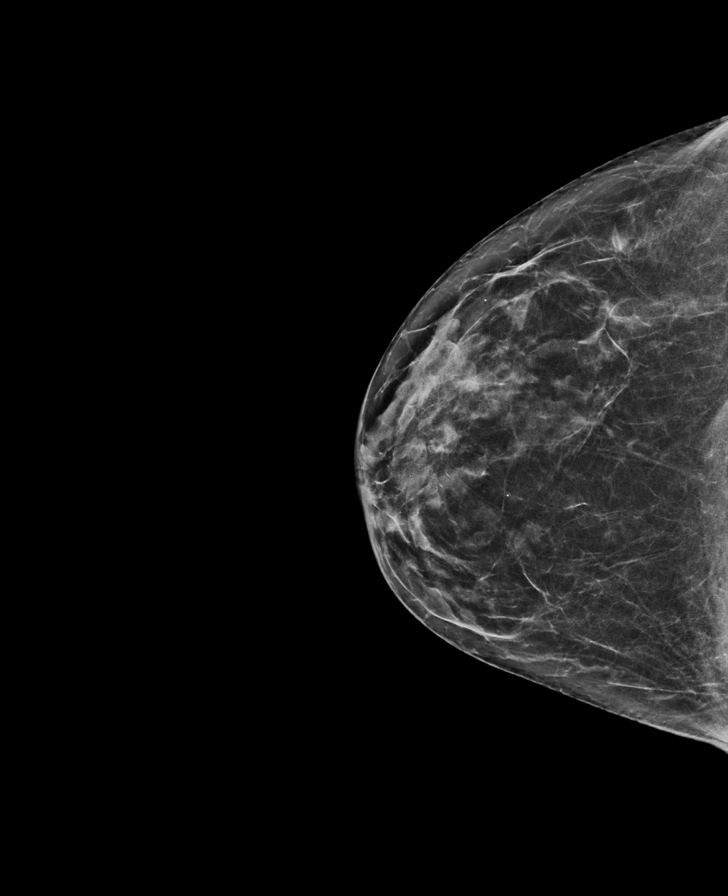

[L CC synth-2D]
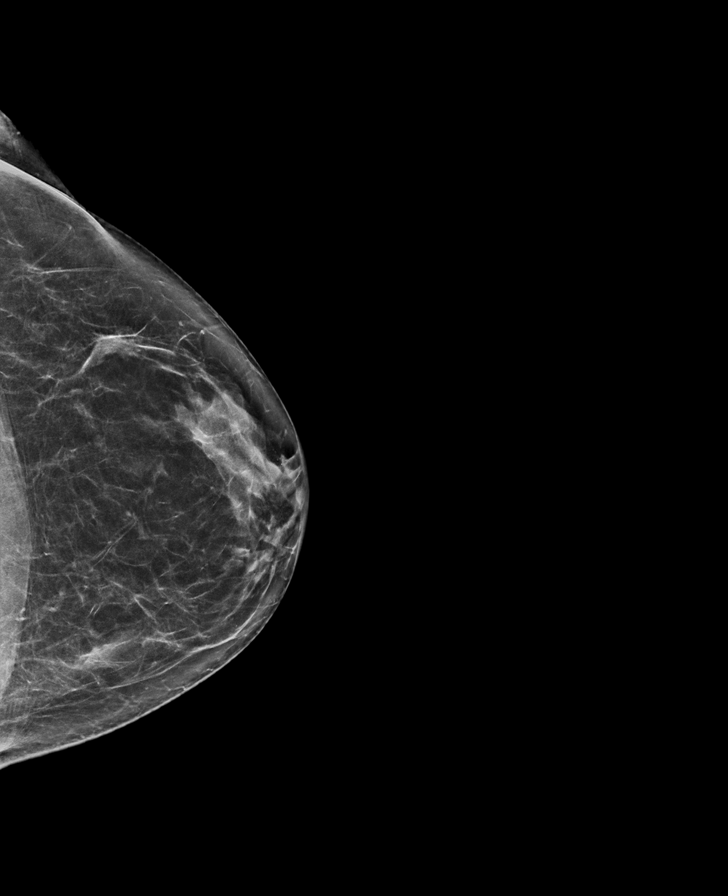

[R MLO tomo · tomo slice 35/68.0]
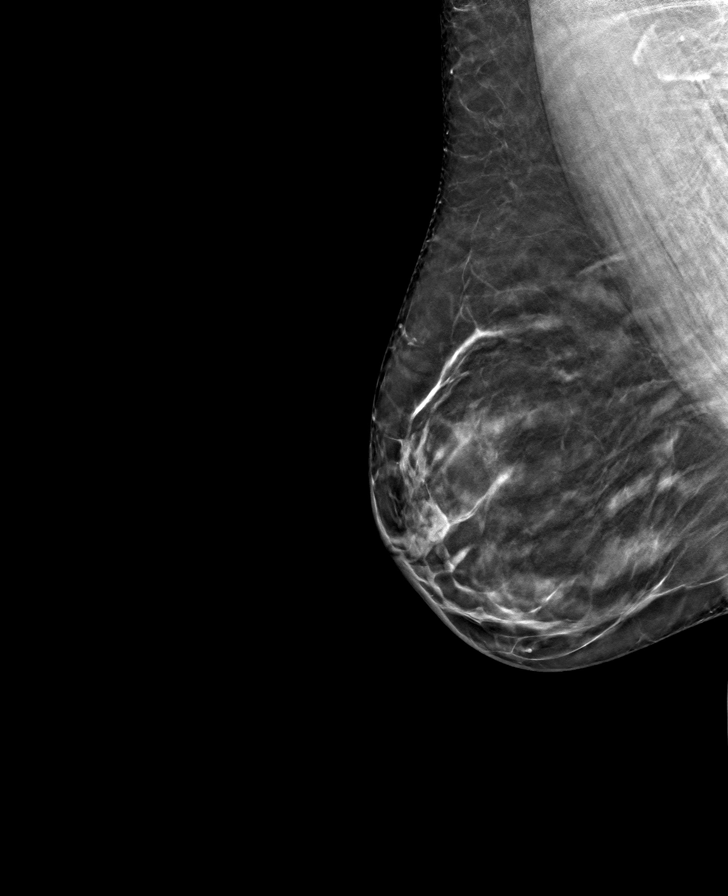

[R CC tomo · tomo slice 32/63.0]
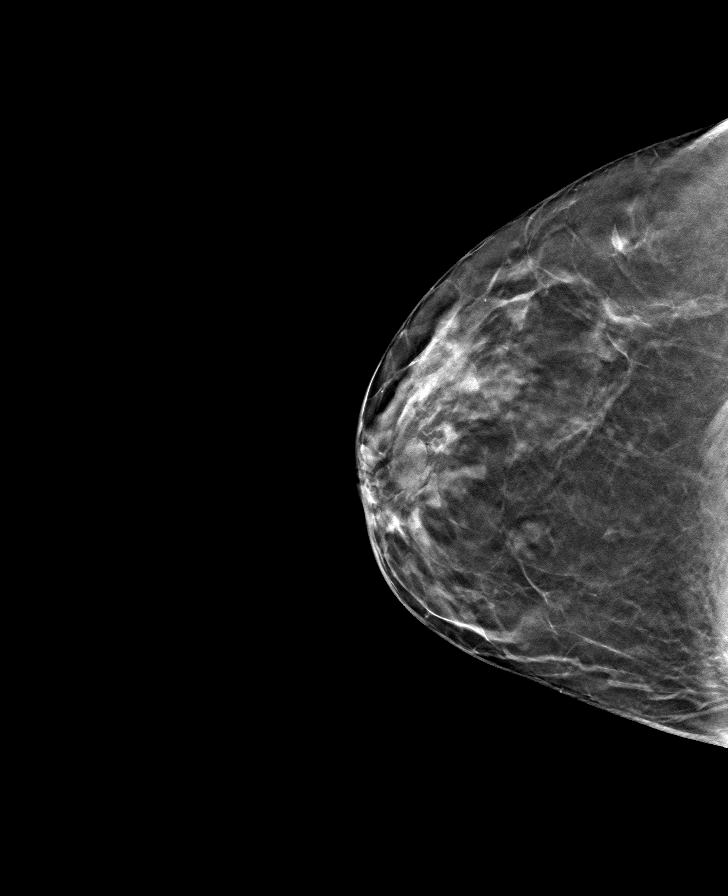

[L CC tomo · tomo slice 33/66.0]
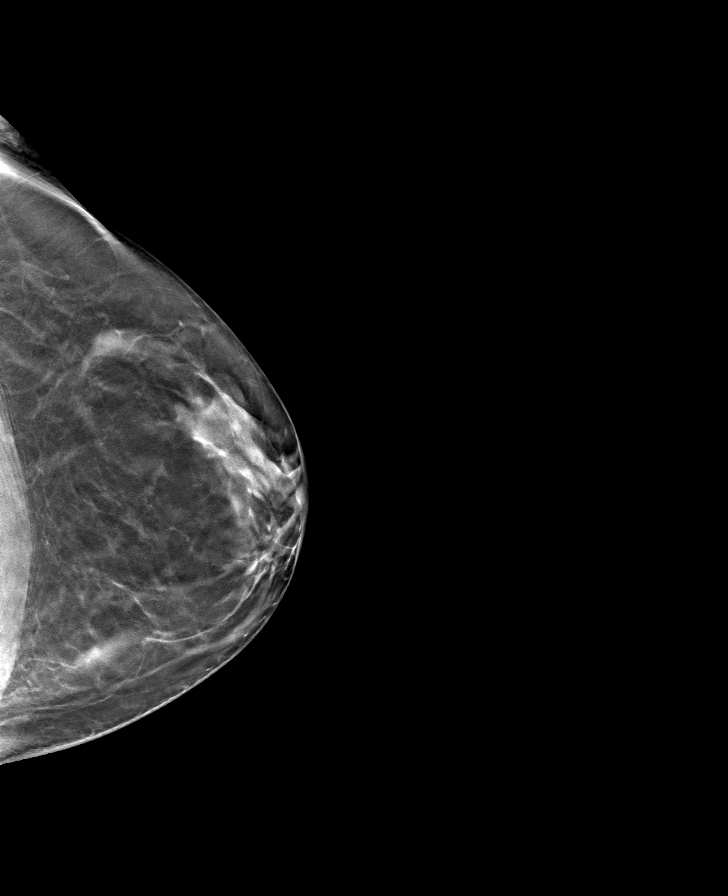

[L MLO tomo · tomo slice 35/69.0]
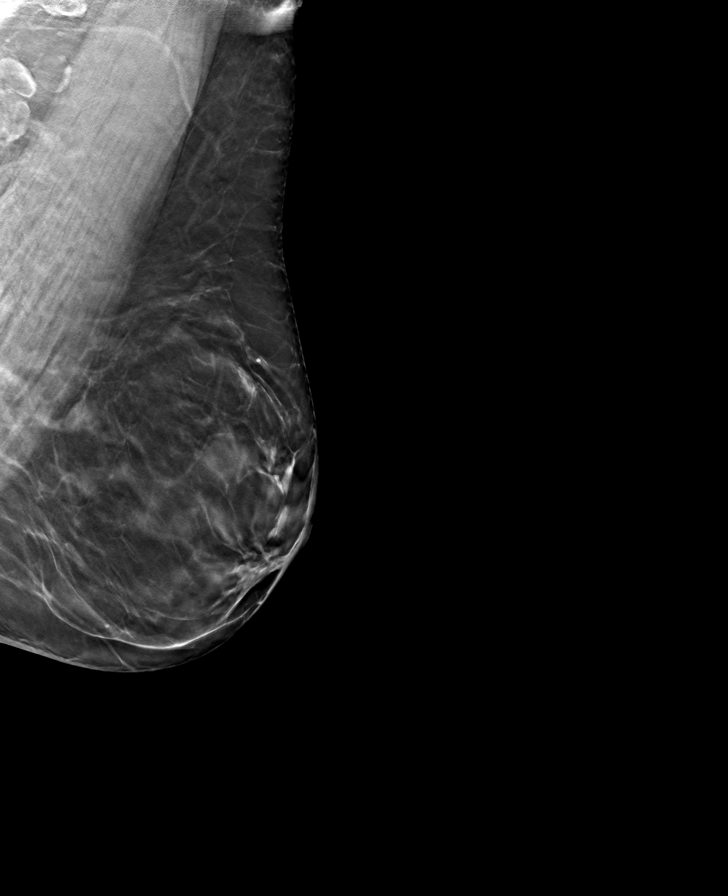

[8 of 24 positions shown; findings below may reference images not displayed]

ACR Breast Density Category c: The breast tissue is heterogeneously
dense, which may obscure small masses.
FINDINGS: There are no findings suspicious for malignancy. Images were
processed with CAD.
IMPRESSION: No mammographic evidence of malignancy. A result letter of this
screening mammogram will be mailed directly to the patient.

RECOMMENDATION:
Screening mammogram in one year. (Code:FT-[id number])

BI-RADS CATEGORY  1: Negative.

## 2020-02-18 ENCOUNTER — Other Ambulatory Visit: Payer: Self-pay | Admitting: *Deleted

## 2020-02-18 MED ORDER — BUDESONIDE-FORMOTEROL FUMARATE 160-4.5 MCG/ACT IN AERO: 2.0000 | INHALATION_SPRAY | Freq: Two times a day (BID) | RESPIRATORY_TRACT | 3 refills | Status: AC

## 2020-02-25 ENCOUNTER — Ambulatory Visit: Payer: No Typology Code available for payment source | Admitting: Family Medicine

## 2020-02-25 ENCOUNTER — Encounter: Payer: Self-pay | Admitting: Family Medicine

## 2020-02-25 ENCOUNTER — Other Ambulatory Visit: Payer: Self-pay

## 2020-02-25 VITALS — BP 122/60 | HR 64 | Temp 98.4°F | Resp 14 | Ht 64.0 in | Wt 156.0 lb

## 2020-02-25 DIAGNOSIS — R5383 Other fatigue: Secondary | ICD-10-CM

## 2020-02-25 DIAGNOSIS — Z1159 Encounter for screening for other viral diseases: Secondary | ICD-10-CM

## 2020-02-25 DIAGNOSIS — E782 Mixed hyperlipidemia: Secondary | ICD-10-CM | POA: Diagnosis not present

## 2020-02-25 DIAGNOSIS — R0602 Shortness of breath: Secondary | ICD-10-CM | POA: Diagnosis not present

## 2020-02-25 DIAGNOSIS — J4520 Mild intermittent asthma, uncomplicated: Secondary | ICD-10-CM

## 2020-02-25 DIAGNOSIS — Z0000 Encounter for general adult medical examination without abnormal findings: Secondary | ICD-10-CM

## 2020-02-25 DIAGNOSIS — Z0001 Encounter for general adult medical examination with abnormal findings: Secondary | ICD-10-CM

## 2020-02-25 DIAGNOSIS — G4700 Insomnia, unspecified: Secondary | ICD-10-CM

## 2020-02-25 MED ORDER — ALBUTEROL SULFATE HFA 108 (90 BASE) MCG/ACT IN AERS: INHALATION_SPRAY | RESPIRATORY_TRACT | 1 refills | Status: AC

## 2020-02-25 MED ORDER — TEMAZEPAM 7.5 MG PO CAPS: 7.5000 mg | ORAL_CAPSULE | Freq: Every evening | ORAL | 0 refills | Status: DC | PRN

## 2020-02-25 NOTE — Progress Notes (Signed)
Subjective:    Patient ID: Paula Beasley, female    DOB: Jul 30, 1961, 59 y.o.   MRN: [id number]  Patient presents for Annual Exam (is fasting- defer PAP) and Fatigue (insomnia- waking up frequently, causing tiredness throughout day )   Pt here for CPE medications/history reviewed    Mammogram-UTD  Colonoscopy- UTD 2019  Due for PAP Smear-   Hyperlipidemia- taking lipitor  Three times aa week, leg pain did improve   Asthma- currently on symbicort for maintanance and prn albuterol    Immunizations- Declines   Had  GI issues/diarrhea , had colonoscopy which showed some IBD changes but CT did not show the changes, she did have ? gastric polyps but she didn't have any stomach pain that time so EGD was deferred   no current meds  [facility] - History of SCC of skin- last dermatology appt was   This month, she has BCC on left arm, had 2 precanersou lesion on face frozen   Had biopsy on end of nose    Discussed Hep C screening   Wakes up with hot flashes every 2 hours   this has been going on for  No falling asleep  Has tried sleepy time tea Has tried melatonin  She gets more winded   Over the pasat few months - When she walks the dog she gets winded, she does a 3 mile loop but has to stop But feels tired more than she usually does when she is exercising.  She always has of tight feeling in her chest as well.  She notes that she only use her Symbicort as needed instead of daily for prevention.  She has not had any cough or congestion or wheezing.     Review Of Systems:  GEN- denies fatigue, fever, weight loss,weakness, recent illness HEENT- denies eye drainage, change in vision, nasal discharge, CVS- denies chest pain, palpitations RESP- denies SOB, cough, wheeze ABD- denies N/V, change in stools, abd pain GU- denies dysuria, hematuria, dribbling, incontinence MSK- denies joint pain, muscle aches, injury Neuro- denies headache, dizziness, syncope, seizure  activity       Objective:    BP 122/60   Pulse 64   Temp 98.4 F (36.9 C) (Temporal)   Resp 14   Ht 5' 4"  (1.626 m)   Wt 156 lb (70.8 kg)   SpO2 100%   BMI 26.78 kg/m  GEN- NAD, alert and oriented x3 HEENT- PERRL, EOMI, non injected sclera, pink conjunctiva, MMM, oropharynx clear, TM clear no effusion Neck- Supple, no thyromegaly, no LAD  CVS- RRR, no murmur RESP-CTAB ABD-NABS,soft,NT,ND EXT- No edema Pulses- Radial, DP- 2+   EKG- NSR, flat t waves lateral lead      Assessment & Plan:      Problem List Items Addressed This Visit      Unprioritized   Asthma    EKG does not show any acute changes.  She does have some changes that can be consistent with pulmonary disease in the setting of her asthma.  I recommend that she take her Symbicort twice a day consistently for the next 2 to 3 weeks to see if she notes a difference in the symptoms that she is experiencing.  We will also check her labs.  If no improvement with Symbicort use and nothing found in the labs that she has continued issues we will then get her set up with cardiology.      Relevant Medications   albuterol (VENTOLIN HFA) 108 (90  Base) MCG/ACT inhaler   Hyperlipidemia   Relevant Orders   Lipid panel (Completed)   Insomnia    Related to her hormonal menopausal hot flashes Can not due hormone therapy OTC meds have not helped No concurrent depression/anxiety at this time Will try Restoril 7.19m prn        Other Visit Diagnoses    Routine general medical examination at a health care facility    -  Primary   CPE done, declines PAP , return for immunizations in fall at pt request , Hep C screen   Relevant Orders   CBC with Differential/Platelet (Completed)   Comprehensive metabolic panel (Completed)   Fatigue, unspecified type       Relevant Orders   TSH (Completed)   Vitamin D, 25-hydroxy (Completed)   SOB (shortness of breath)       Relevant Orders   EKG 12-Lead (Completed)   Need for  hepatitis C screening test       Relevant Orders   Hepatitis C antibody      Note: This dictation was prepared with Dragon dictation along with smaller phrase technology. Any transcriptional errors that result from this process are unintentional.

## 2020-02-25 NOTE — Assessment & Plan Note (Signed)
Related to her hormonal menopausal hot flashes Can not due hormone therapy OTC meds have not helped No concurrent depression/anxiety at this time Will try Restoril 7.74m prn

## 2020-02-25 NOTE — Progress Notes (Signed)
Peak flow: 400 400 400

## 2020-02-25 NOTE — Patient Instructions (Signed)
We will call with results Use your symbicort twice a day  Return in Oct for nurse visit for TDAP and flu shot  Try restoril for sleep as needed  F/U 6 months

## 2020-02-25 NOTE — Assessment & Plan Note (Addendum)
EKG does not show any acute changes.  She does have some changes that can be consistent with pulmonary disease in the setting of her asthma.  I recommend that she take her Symbicort twice a day consistently for the next 2 to 3 weeks to see if she notes a difference in the symptoms that she is experiencing.  We will also check her labs.  If no improvement with Symbicort use and nothing found in the labs that she has continued issues we will then get her set up with cardiology.

## 2020-02-26 ENCOUNTER — Encounter: Payer: Self-pay | Admitting: Family Medicine

## 2020-02-26 LAB — LIPID PANEL
Cholesterol: 223 mg/dL — ABNORMAL HIGH (ref <200)
HDL: 54 mg/dL (ref >=50)
LDL Cholesterol (Calc): 148 mg/dL (calc) — ABNORMAL HIGH
Non-HDL Cholesterol (Calc): 169 mg/dL (calc) — ABNORMAL HIGH (ref <130)
Total CHOL/HDL Ratio: 4.1 (calc) (ref <5.0)
Triglycerides: 99 mg/dL (ref <150)

## 2020-02-26 LAB — VITAMIN D 25 HYDROXY (VIT D DEFICIENCY, FRACTURES): Vit D, 25-Hydroxy: 30 ng/mL (ref 30–100)

## 2020-02-26 LAB — COMPREHENSIVE METABOLIC PANEL
AG Ratio: 1.8 (calc) (ref 1.0–2.5)
ALT: 12 U/L (ref 6–29)
AST: 17 U/L (ref 10–35)
Albumin: 4.6 g/dL (ref 3.6–5.1)
Alkaline phosphatase (APISO): 83 U/L (ref 37–153)
BUN: 21 mg/dL (ref 7–25)
CO2: 24 mmol/L (ref 20–32)
Calcium: 9.8 mg/dL (ref 8.6–10.4)
Chloride: 103 mmol/L (ref 98–110)
Creat: 0.95 mg/dL (ref 0.50–0.99)
Globulin: 2.5 g/dL (calc) (ref 1.9–3.7)
Glucose, Bld: 96 mg/dL (ref 65–99)
Potassium: 4.6 mmol/L (ref 3.5–5.3)
Sodium: 140 mmol/L (ref 135–146)
Total Bilirubin: 0.7 mg/dL (ref 0.2–1.2)
Total Protein: 7.1 g/dL (ref 6.1–8.1)

## 2020-02-26 LAB — CBC WITH DIFFERENTIAL/PLATELET
Absolute Monocytes: 498 cells/uL (ref 200–950)
Basophils Absolute: 39 cells/uL (ref 0–200)
Basophils Relative: 0.7 %
Eosinophils Absolute: 179 cells/uL (ref 15–500)
Eosinophils Relative: 3.2 %
HCT: 45.1 % — ABNORMAL HIGH (ref 35.0–45.0)
Hemoglobin: 15.3 g/dL (ref 11.7–15.5)
Lymphs Abs: 1310 cells/uL (ref 850–3900)
MCH: 29.9 pg (ref 27.0–33.0)
MCHC: 33.9 g/dL (ref 32.0–36.0)
MCV: 88.3 fL (ref 80.0–100.0)
MPV: 11.3 fL (ref 7.5–12.5)
Monocytes Relative: 8.9 %
Neutro Abs: 3573 cells/uL (ref 1500–7800)
Neutrophils Relative %: 63.8 %
Platelets: 205 10*3/uL (ref 140–400)
RBC: 5.11 10*6/uL — ABNORMAL HIGH (ref 3.80–5.10)
RDW: 11.8 % (ref 11.0–15.0)
Total Lymphocyte: 23.4 %
WBC: 5.6 10*3/uL (ref 3.8–10.8)

## 2020-02-26 LAB — TSH: TSH: 2.56 mIU/L (ref 0.40–4.50)

## 2020-02-26 LAB — HEPATITIS C ANTIBODY
Hepatitis C Ab: NONREACTIVE
SIGNAL TO CUT-OFF: 0 (ref <1.00)

## 2020-03-22 ENCOUNTER — Encounter: Payer: Self-pay | Admitting: Family Medicine

## 2020-03-25 MED ORDER — TEMAZEPAM 7.5 MG PO CAPS: 7.5000 mg | ORAL_CAPSULE | Freq: Every evening | ORAL | 0 refills | Status: DC | PRN

## 2020-03-30 MED ORDER — TEMAZEPAM 15 MG PO CAPS
15.0000 mg | ORAL_CAPSULE | Freq: Every evening | ORAL | 1 refills | Status: AC | PRN
Start: 2020-03-30 — End: ?

## 2020-03-30 NOTE — Addendum Note (Signed)
Addended by: Vic Blackbird F on: 03/30/2020 12:59 PM   Modules accepted: Orders

## 2020-06-06 ENCOUNTER — Other Ambulatory Visit (INDEPENDENT_AMBULATORY_CARE_PROVIDER_SITE_OTHER): Payer: No Typology Code available for payment source | Admitting: *Deleted

## 2020-06-06 ENCOUNTER — Other Ambulatory Visit: Payer: Self-pay

## 2020-06-06 DIAGNOSIS — Z23 Encounter for immunization: Secondary | ICD-10-CM | POA: Diagnosis not present

## 2020-06-20 ENCOUNTER — Encounter: Payer: Self-pay | Admitting: Family Medicine

## 2020-06-20 MED ORDER — DIPHENOXYLATE-ATROPINE 2.5-0.025 MG PO TABS: 1.0000 | ORAL_TABLET | Freq: Four times a day (QID) | ORAL | 1 refills | Status: AC | PRN

## 2020-06-20 NOTE — Telephone Encounter (Signed)
Okay to call in

## 2020-06-20 NOTE — Telephone Encounter (Signed)
Ok to call in

## 2020-07-17 ENCOUNTER — Telehealth: Payer: Self-pay | Admitting: Family Medicine

## 2020-07-17 NOTE — Telephone Encounter (Signed)
Patient will need appointment.

## 2020-07-17 NOTE — Telephone Encounter (Signed)
Pt call on the voice mail has a UTI would like to drop urine sample if possible

## 2020-07-21 ENCOUNTER — Other Ambulatory Visit: Payer: Self-pay

## 2020-07-21 ENCOUNTER — Ambulatory Visit (INDEPENDENT_AMBULATORY_CARE_PROVIDER_SITE_OTHER): Payer: No Typology Code available for payment source | Admitting: Family Medicine

## 2020-07-21 ENCOUNTER — Encounter: Payer: Self-pay | Admitting: Family Medicine

## 2020-07-21 VITALS — BP 130/68 | HR 82 | Temp 100.6°F | Resp 16 | Ht 64.0 in | Wt 156.0 lb

## 2020-07-21 DIAGNOSIS — R509 Fever, unspecified: Secondary | ICD-10-CM

## 2020-07-21 DIAGNOSIS — N390 Urinary tract infection, site not specified: Secondary | ICD-10-CM | POA: Diagnosis not present

## 2020-07-21 DIAGNOSIS — E860 Dehydration: Secondary | ICD-10-CM | POA: Diagnosis not present

## 2020-07-21 LAB — URINALYSIS, ROUTINE W REFLEX MICROSCOPIC
Bacteria, UA: NONE SEEN /HPF
Bilirubin Urine: NEGATIVE
Glucose, UA: NEGATIVE
Hyaline Cast: NONE SEEN /LPF
Ketones, ur: NEGATIVE
Leukocytes,Ua: NEGATIVE
Nitrite: NEGATIVE
Protein, ur: NEGATIVE
Specific Gravity, Urine: 1.025 (ref 1.001–1.03)
WBC, UA: NONE SEEN /HPF (ref 0–5)
pH: 5.5 (ref 5.0–8.0)

## 2020-07-21 LAB — CBC WITH DIFFERENTIAL/PLATELET
Absolute Monocytes: 549 cells/uL (ref 200–950)
Basophils Absolute: 13 cells/uL (ref 0–200)
Basophils Relative: 0.1 %
Eosinophils Absolute: 348 cells/uL (ref 15–500)
Eosinophils Relative: 2.6 %
HCT: 47.7 % — ABNORMAL HIGH (ref 35.0–45.0)
Hemoglobin: 16.7 g/dL — ABNORMAL HIGH (ref 11.7–15.5)
Lymphs Abs: 322 cells/uL — ABNORMAL LOW (ref 850–3900)
MCH: 30.5 pg (ref 27.0–33.0)
MCHC: 35 g/dL (ref 32.0–36.0)
MCV: 87 fL (ref 80.0–100.0)
MPV: 10.4 fL (ref 7.5–12.5)
Monocytes Relative: 4.1 %
Neutro Abs: 12167 cells/uL — ABNORMAL HIGH (ref 1500–7800)
Neutrophils Relative %: 90.8 %
Platelets: 197 10*3/uL (ref 140–400)
RBC: 5.48 10*6/uL — ABNORMAL HIGH (ref 3.80–5.10)
RDW: 11.7 % (ref 11.0–15.0)
Total Lymphocyte: 2.4 %
WBC: 13.4 10*3/uL — ABNORMAL HIGH (ref 3.8–10.8)

## 2020-07-21 LAB — COMPREHENSIVE METABOLIC PANEL
AG Ratio: 2 (calc) (ref 1.0–2.5)
ALT: 13 U/L (ref 6–29)
AST: 15 U/L (ref 10–35)
Albumin: 4.7 g/dL (ref 3.6–5.1)
Alkaline phosphatase (APISO): 93 U/L (ref 37–153)
BUN: 21 mg/dL (ref 7–25)
CO2: 27 mmol/L (ref 20–32)
Calcium: 9.6 mg/dL (ref 8.6–10.4)
Chloride: 98 mmol/L (ref 98–110)
Creat: 0.9 mg/dL (ref 0.50–0.99)
Globulin: 2.4 g/dL (calc) (ref 1.9–3.7)
Glucose, Bld: 113 mg/dL — ABNORMAL HIGH (ref 65–99)
Potassium: 4.4 mmol/L (ref 3.5–5.3)
Sodium: 135 mmol/L (ref 135–146)
Total Bilirubin: 1 mg/dL (ref 0.2–1.2)
Total Protein: 7.1 g/dL (ref 6.1–8.1)

## 2020-07-21 LAB — MICROSCOPIC MESSAGE

## 2020-07-21 MED ORDER — DEXTROSE 5 % IV SOLN
1.0000 g | Freq: Once | INTRAVENOUS | Status: AC
  Administered 2020-07-21: 1 g via INTRAVENOUS

## 2020-07-21 MED ORDER — CEFTRIAXONE SODIUM 1 G IJ SOLR: 1.0000 g | Freq: Once | INTRAMUSCULAR | Status: DC

## 2020-07-21 MED ORDER — SULFAMETHOXAZOLE-TRIMETHOPRIM 800-160 MG PO TABS: 1.0000 | ORAL_TABLET | Freq: Two times a day (BID) | ORAL | 0 refills | Status: DC

## 2020-07-21 NOTE — Patient Instructions (Signed)
Rocephin injection given tody and fluids We will call with lab results  F/U pending results

## 2020-07-21 NOTE — Progress Notes (Signed)
Subjective:    Patient ID: Paula Beasley, female    DOB: April 07, 1961, 59 y.o.   MRN: [id number]  Patient presents for Illness (x5 days- UTI Sx- has been seen at [facility] and given ABTx- fatigue fever)   Pt here with fever, fatigue, UTI symptoms for 5 days, She was seen at [facility]   She was in [place], very stressful trip, she was not drinking like she could think that she did become dehydrated. Tuesday started with dysuria  Thursday seen at [facility], started macrobid, told UA showed a little bacteri with history they started treatment She was feeling better, for a day or 2 but then she began to worsen with more pressure pain with urination as well as fatigue and low-grade fever.    No vomiting , no diarrhea , URI symptoms , mild allergy symptoms but othing out of the usual   Took COVID test was negative She has had flu/ Tdap and Covid vaccination   Review Of Systems:  GEN- +fatigue,+ fever, weight loss,weakness, recent illness HEENT- denies eye drainage, change in vision, nasal discharge, CVS- denies chest pain, palpitations RESP- denies SOB, cough, wheeze ABD- denies N/V, change in stools,+ abd pain GU- + dysuria, hematuria, dribbling, incontinence MSK- denies joint pain, muscle aches, injury Neuro- denies headache, dizziness, syncope, seizure activity       Objective:    BP 130/68   Pulse 82   Temp (!) 100.6 F (38.1 C) (Oral)   Resp 16   Ht 5' 4"  (1.626 m)   Wt 156 lb (70.8 kg)   SpO2 98%   BMI 26.78 kg/m  GEN- NAD, alert and oriented x3, pale appearing HEENT- PERRL, EOMI, non injected sclera, pink conjunctiva, dry mucous membranes dry lips oropharynx clear Neck- Supple, no thyromegaly, shotty submandibular and anterior lymphadenopathy CVS- RRR, no murmur, cap refill 3 seconds RESP-CTAB ABD-NABS,soft, NT palpation lower abdomen suprapubic region no CVA tenderness EXT- No edema Pulses- Radial2+        Assessment & Plan:      Problem List Items Addressed This Visit     None    Visit Diagnoses    Urinary tract infection without hematuria, site unspecified    -  Primary   Concern for progressive urinary tract infection versus another underlying illness.  Stat labs obtained white blood cell count elevated at 13,000 Reviewed notes from urgent care she did have pansensitive E. coli.  She was given Rocephin 1 g in the office she tolerated without any side effects.  She also received 1 L normal saline via IV in the office. Renal function normal as well as liver function.  She will start Bactrim and discontinue the Macrobid Push fluids because she has recently traveled out of state Covid PCR also obtained If she  is unable to tolerate liquids will need to go to the emergency room.   Relevant Medications      phenazopyridine (PYRIDIUM) 100 MG tablet   cefTRIAXone (ROCEPHIN) 1 g in dextrose 5 % 50 mL IVPB (Completed)   sulfamethoxazole-trimethoprim (BACTRIM DS) 800-160 MG tablet   Other Relevant Orders   Urinalysis, Routine w reflex microscopic (Completed)   CBC with Differential/Platelet (Completed)   Comprehensive metabolic panel (Completed)   Urine Culture   Fever, unspecified fever cause       Relevant Orders   CBC with Differential/Platelet (Completed)   Comprehensive metabolic panel (Completed)   SARS-COV-2 RNA,(COVID-19) QUAL NAAT   Dehydration       Relevant Orders  CBC with Differential/Platelet (Completed)   Comprehensive metabolic panel (Completed)      Note: This dictation was prepared with Dragon dictation along with smaller phrase technology. Any transcriptional errors that result from this process are unintentional.

## 2020-07-22 ENCOUNTER — Encounter: Payer: Self-pay | Admitting: Family Medicine

## 2020-07-22 LAB — URINE CULTURE
MICRO NUMBER:: 11203398
Result:: NO GROWTH
SPECIMEN QUALITY:: ADEQUATE

## 2020-07-22 LAB — SARS-COV-2 RNA,(COVID-19) QUALITATIVE NAAT: SARS CoV2 RNA: NOT DETECTED

## 2020-07-23 ENCOUNTER — Encounter: Payer: Self-pay | Admitting: Family Medicine

## 2020-07-23 ENCOUNTER — Other Ambulatory Visit: Payer: Self-pay

## 2020-07-23 ENCOUNTER — Emergency Department (HOSPITAL_COMMUNITY)
Admission: EM | Admit: 2020-07-23 | Discharge: 2020-07-23 | Disposition: A | Discharge status: Home / Self Care | Payer: No Typology Code available for payment source | Attending: Emergency Medicine | Admitting: Emergency Medicine

## 2020-07-23 ENCOUNTER — Encounter: Payer: Self-pay | Admitting: *Deleted

## 2020-07-23 ENCOUNTER — Encounter (HOSPITAL_COMMUNITY): Payer: Self-pay | Admitting: Emergency Medicine

## 2020-07-23 DIAGNOSIS — R202 Paresthesia of skin: Secondary | ICD-10-CM | POA: Diagnosis not present

## 2020-07-23 DIAGNOSIS — T7840XA Allergy, unspecified, initial encounter: Secondary | ICD-10-CM | POA: Diagnosis not present

## 2020-07-23 DIAGNOSIS — R21 Rash and other nonspecific skin eruption: Secondary | ICD-10-CM | POA: Insufficient documentation

## 2020-07-23 DIAGNOSIS — Z5321 Procedure and treatment not carried out due to patient leaving prior to being seen by health care provider: Secondary | ICD-10-CM | POA: Insufficient documentation

## 2020-07-23 NOTE — ED Triage Notes (Signed)
Patient sent by PCP for evaluation of allergic reaction after fourth dose of macrobid this morning at 0800. Patient took 41m benadryl and 227mpepcid at 1630 and came to ED. No dyspnea, patient complains of tingling in the mouth and rashes to the arms and legs.

## 2020-07-23 NOTE — ED Notes (Signed)
Pt leaving AMA states that she is feeling better since she took her antihistamines.

## 2020-07-24 ENCOUNTER — Telehealth: Payer: Self-pay | Admitting: *Deleted

## 2020-07-24 ENCOUNTER — Encounter: Payer: Self-pay | Admitting: Family Medicine

## 2020-07-24 MED ORDER — PREDNISONE 10 MG PO TABS: ORAL_TABLET | ORAL | 0 refills | Status: DC

## 2020-07-24 NOTE — Telephone Encounter (Signed)
noted 

## 2020-07-24 NOTE — Telephone Encounter (Signed)
-----   Message from Alycia Rossetti, MD sent at 07/24/2020  8:59 AM EST ----- Regarding: Call pt  Call pt seen in ER for allergic reaction?   Advise to stop the bactrim if still taking Take benadryl, if she has hives all other, okay to send prednisone taper

## 2020-07-24 NOTE — Telephone Encounter (Signed)
Call placed to patient.   States that she has been taking Benadryl and Pepcid as directed. States that she did notice increased tingling in her lips and mouth, and her tongue was swollen. Reports that did went to ER to be evaluated and was seen by nurses. States that after a few hours, Sx were improved and she felt safe to return home.   States that she still has rash, but tongue swelling and lip tingling has resolved.   Prescription sent to pharmacy for Pred Taper. Advised to F/U as needed.

## 2020-08-18 ENCOUNTER — Ambulatory Visit: Payer: No Typology Code available for payment source | Admitting: Family Medicine

## 2020-09-18 ENCOUNTER — Other Ambulatory Visit: Payer: Self-pay | Admitting: Family Medicine

## 2020-12-08 ENCOUNTER — Other Ambulatory Visit: Payer: Self-pay | Admitting: Family Medicine

## 2020-12-08 DIAGNOSIS — Z1231 Encounter for screening mammogram for malignant neoplasm of breast: Secondary | ICD-10-CM

## 2021-01-25 ENCOUNTER — Encounter: Payer: Self-pay | Admitting: Family Medicine

## 2021-01-27 ENCOUNTER — Other Ambulatory Visit: Payer: Self-pay

## 2021-01-27 ENCOUNTER — Ambulatory Visit
Admission: RE | Admit: 2021-01-27 | Discharge: 2021-01-27 | Disposition: A | Discharge status: Home / Self Care | Payer: No Typology Code available for payment source | Source: Ambulatory Visit | Attending: Family Medicine | Admitting: Family Medicine

## 2021-01-27 DIAGNOSIS — Z1231 Encounter for screening mammogram for malignant neoplasm of breast: Secondary | ICD-10-CM

## 2021-01-28 ENCOUNTER — Ambulatory Visit: Payer: No Typology Code available for payment source | Admitting: Nurse Practitioner

## 2021-01-28 VITALS — BP 132/82 | HR 9 | Ht 64.0 in | Wt 161.4 lb

## 2021-01-28 DIAGNOSIS — M25561 Pain in right knee: Secondary | ICD-10-CM | POA: Diagnosis not present

## 2021-01-28 NOTE — Progress Notes (Signed)
Subjective:    Patient ID: Paula Beasley, female    DOB: [birth year], 60 y.o.   MRN: [id number]  HPI: Paula Beasley is a 60 y.o. female presenting for knee pain.  Chief Complaint  Patient presents with  . Pain    2 wks of pain from wrong turn while walking in kitchen, pivoted on right foot incorrectly, taking no pian meds. Pain comes and goes   KNEE PAIN Duration: weeks Involved knee: right Mechanism of injury: twisting Location:diffuse; under knee cdap Onset: sudden Severity: severity  Quality: deep pain Frequency: intermittent Radiation: no Aggravating factors: certain movements, walking on it Alleviating factors: rest, time, Tylenol/ibuprofen, ice/heat, knee brace made no difference Status: stable Treatments attempted: ibuprofen/Tylenol  Relief with NSAIDs?:  mild Weakness with weight bearing or walking: yes at times Sensation of giving way: no Locking: no  Clicking: yes - with movement Popping: no Bruising: no Swelling: no Redness: no Paresthesias/decreased sensation: no Fevers: no   Allergies  Allergen Reactions  . Amoxicillin Rash  . Shellfish Allergy   . Penicillins Rash  . Sulfa Antibiotics Rash    Outpatient Encounter Medications as of 01/28/2021  Medication Sig  . albuterol (VENTOLIN HFA) 108 (90 Base) MCG/ACT inhaler INHALE 2 PUFFS INTO THE LUNGS EVERY 6 HOURS AS NEEDED FOR WHEEZING OR SHORTNESS OF BREATH  . atorvastatin (LIPITOR) 20 MG tablet Take 1 tablet (20 mg total) by mouth 3 (three) times a week.  . budesonide-formoterol (SYMBICORT) 160-4.5 MCG/ACT inhaler Inhale 2 puffs into the lungs 2 (two) times daily. (Patient taking differently: Inhale 2 puffs into the lungs 2 (two) times daily. PRN- seasonal)  . cetirizine (ZYRTEC) 10 MG tablet Take 10 mg by mouth daily as needed.   . diphenhydrAMINE (BENADRYL) 25 MG tablet Take 25 mg by mouth every 6 (six) hours as needed.  . diphenoxylate-atropine (LOMOTIL) 2.5-0.025 MG tablet Take 1-2 tablets by mouth 4  (four) times daily as needed for diarrhea or loose stools.  . fluticasone (FLONASE) 50 MCG/ACT nasal spray SHAKE LIQUID AND USE 2 SPRAYS IN EACH NOSTRIL DAILY  . phenazopyridine (PYRIDIUM) 100 MG tablet Take 100 mg by mouth 3 (three) times daily as needed.  . temazepam (RESTORIL) 15 MG capsule Take 1 capsule (15 mg total) by mouth at bedtime as needed for sleep.  . [DISCONTINUED] predniSONE (DELTASONE) 10 MG tablet Take 29m on days 1-2. Take 352mon days 3-4. Take 2058mn days 5-6. Take 79m58m days 7-8. Take 5mg 60mdays 9-10, then stop.   Facility-Administered Encounter Medications as of 01/28/2021  Medication  . 0.9 %  sodium chloride infusion    Patient Active Problem List   Diagnosis Date Noted  . Insomnia 02/25/2020  . Pain in both knees 03/20/2019  . Pain of left hip joint 03/20/2019  . Pelvic floor dysfunction in female 02/22/2019  . Varicosities of leg 02/22/2019  . Allergy   . Asthma   . History of SCC (squamous cell carcinoma) of skin   . Hyperlipidemia     Past Medical History:  Diagnosis Date  . Allergy   . Asthma   . Cancer (HCC) [place]  . Hyperlipidemia     Relevant past medical, surgical, family and social history reviewed and updated as indicated. Interim medical history since our last visit reviewed.  Review of Systems Per HPI unless specifically indicated above     Objective:    BP 132/82   Pulse (!) 9   Ht 5' 4"  (1.626  m)   Wt 161 lb 6.4 oz (73.2 kg)   BMI 27.70 kg/m   Wt Readings from Last 3 Encounters:  01/28/21 161 lb 6.4 oz (73.2 kg)  07/21/20 156 lb (70.8 kg)  02/25/20 156 lb (70.8 kg)    Physical Exam Vitals and nursing note reviewed.  Constitutional:      General: She is not in acute distress.    Appearance: She is not toxic-appearing.  Musculoskeletal:        General: Normal range of motion.     Right hip: Normal. No tenderness. Normal range of motion.     Left hip: Normal. No tenderness. Normal range of motion.     Right knee:  Swelling, effusion and crepitus present. No ecchymosis or bony tenderness. Normal range of motion. No tenderness. Normal pulse.     Left knee: Crepitus present. Normal pulse.     Right lower leg: No edema.     Left lower leg: No edema.  Skin:    General: Skin is warm and dry.     Coloration: Skin is not jaundiced or pale.     Findings: No erythema.  Neurological:     Mental Status: She is alert and oriented to person, place, and time.     Gait: Gait normal.  Psychiatric:        Mood and Affect: Mood normal.        Behavior: Behavior normal.        Thought Content: Thought content normal.        Judgment: Judgment normal.       Assessment & Plan:  1. Acute pain of right knee Acute.  Differentials include damage to ACL/PCL/MCL, patellofemoral pain, muscle strain.  No red flags today -pain is at random and no instability.  Mild effusion today- also with history of bilateral osteoarthritis.  Encouraged ice and use of NSAIDs as needed for pain.  Will place referral to orthopedic surgery for further evaluation work-up-discussed orthopedic urgent care hours with patient.  - Ambulatory referral to Orthopedics     Follow up plan: Return if symptoms worsen or fail to improve.

## 2021-02-05 ENCOUNTER — Telehealth: Payer: Self-pay | Admitting: Family Medicine

## 2021-02-05 NOTE — Telephone Encounter (Signed)
Please call patient and let her know that she can disregard the bill. She doesn't have any outstanding balances with [place].   Thank you!

## 2021-02-05 NOTE — Telephone Encounter (Signed)
Pt called stating that she is receiving collection bills for a bill that she has already paid.   Cb#: [phone]

## 2021-02-06 ENCOUNTER — Telehealth: Payer: Self-pay | Admitting: Family Medicine

## 2021-02-06 NOTE — Telephone Encounter (Signed)
Patient left a voicemail to request call back. Called patient to inform her that she doesn't have an outstanding balance with this office. Patient stated that the bill for $39.25 is a total of two charges: $14.25, and $25.00 and it has affected her credit. Patient uses online bill pay with her bank and doesn't have any additional proof of payment. Patient unsure of how to resolve issue. Patient wants to know who can contact the collection agency to resolve issue and restore credit score. Please advise at [phone].

## 2021-02-12 ENCOUNTER — Telehealth: Payer: Self-pay | Admitting: Family Medicine

## 2021-02-24 ENCOUNTER — Other Ambulatory Visit: Payer: Self-pay

## 2021-02-24 ENCOUNTER — Encounter: Payer: Self-pay | Admitting: Cardiology

## 2021-02-24 ENCOUNTER — Ambulatory Visit: Payer: No Typology Code available for payment source | Admitting: Cardiology

## 2021-02-24 VITALS — BP 136/72 | HR 77 | Ht 65.0 in | Wt 157.0 lb

## 2021-02-24 DIAGNOSIS — R079 Chest pain, unspecified: Secondary | ICD-10-CM

## 2021-02-24 DIAGNOSIS — R072 Precordial pain: Secondary | ICD-10-CM

## 2021-02-24 DIAGNOSIS — E782 Mixed hyperlipidemia: Secondary | ICD-10-CM | POA: Diagnosis not present

## 2021-02-24 DIAGNOSIS — R0600 Dyspnea, unspecified: Secondary | ICD-10-CM | POA: Diagnosis not present

## 2021-02-24 DIAGNOSIS — G4700 Insomnia, unspecified: Secondary | ICD-10-CM

## 2021-02-24 DIAGNOSIS — R0609 Other forms of dyspnea: Secondary | ICD-10-CM

## 2021-02-24 MED ORDER — ASPIRIN EC 81 MG PO TBEC: 81.0000 mg | DELAYED_RELEASE_TABLET | Freq: Every day | ORAL | 6 refills | Status: DC

## 2021-02-24 MED ORDER — METOPROLOL TARTRATE 100 MG PO TABS: ORAL_TABLET | ORAL | 0 refills | Status: DC

## 2021-02-24 MED ORDER — NITROGLYCERIN 0.4 MG SL SUBL: 0.4000 mg | SUBLINGUAL_TABLET | SUBLINGUAL | 6 refills | Status: AC | PRN

## 2021-02-24 NOTE — Progress Notes (Signed)
Cardiology Consultation:    Date:  02/24/2021   ID:  [name], DOB 11-May-1961, MRN [id number]  PCP:  Alycia Rossetti, MD  Cardiologist:  Jenne Campus, MD   Referring MD: Alycia Rossetti, MD   Chief Complaint  Patient presents with   Follow-up    Sleeping issue    History of Present Illness:    Paula Beasley is a 60 y.o. female who is being seen today for the evaluation of chest pain at the request of Buelah Manis, Modena Nunnery, MD. steroids quite interesting at the beginning of conversation she started telling me that the biggest problem she has is insomnia.  She has been struggling with this since she became postmenopausal, she takes some sleeping pill which helped somewhat but she wakes up usually every 2 hours.  Finally she is being scheduled to have sleep apnea study.  Eventually she was sent to endocrinologist who started asking some specific question about symptomatology and it was discovered that she also complained of having some chest pain.  She also described 1 distinct episode when she was walking and developed tightness in the chest.  She was sweating and short of breath when it happened.  She it lasted few minutes.  She also tell me that she will develop chest tightness when she gets upset that been going on for long time and she thinks this is up to the normal part of simply getting upset.  She is still quite active and overall have no difficulty doing things.  She is the episode of chest pain while walking she could walk climb stairs and have no difficulties however she admits that she does not push herself hard enough.  She does have some history of bronchial asthma but only mild.  She used albuterol as well as Symbicort however recently she said that that she is not taking Symbicort and she is feeling better.  She does have dyslipidemia and she takes cholesterol medication however only 3 times a week Lipitor 20 higher dose of this medication make you feel worse.  She will come  was complaining having some muscle aches. She does not smoke, never did Does have family history of coronary artery disease She is trying to be active and walk on the regular basis  Past Medical History:  Diagnosis Date   Allergy    Asthma    Cancer ([place])    skin   Hyperlipidemia     Past Surgical History:  Procedure Laterality Date   APPENDECTOMY     60yo    Current Medications: Current Meds  Medication Sig   albuterol (VENTOLIN HFA) 108 (90 Base) MCG/ACT inhaler INHALE 2 PUFFS INTO THE LUNGS EVERY 6 HOURS AS NEEDED FOR WHEEZING OR SHORTNESS OF BREATH (Patient taking differently: Inhale 2 puffs into the lungs every 6 (six) hours as needed. INHALE 2 PUFFS INTO THE LUNGS EVERY 6 HOURS AS NEEDED FOR WHEEZING OR SHORTNESS OF BREATH)   aspirin EC 81 MG tablet Take 1 tablet (81 mg total) by mouth daily. Swallow whole.   atorvastatin (LIPITOR) 20 MG tablet Take 1 tablet (20 mg total) by mouth 3 (three) times a week. (Patient taking differently: Take 20 mg by mouth daily as needed (cholesterol).)   budesonide-formoterol (SYMBICORT) 160-4.5 MCG/ACT inhaler Inhale 2 puffs into the lungs 2 (two) times daily. (Patient taking differently: Inhale 2 puffs into the lungs 2 (two) times daily. PRN- seasonal)   cetirizine (ZYRTEC) 10 MG tablet Take 10 mg by mouth daily  as needed for allergies.   diphenhydrAMINE (BENADRYL) 25 MG tablet Take 25 mg by mouth every 6 (six) hours as needed for allergies.   diphenoxylate-atropine (LOMOTIL) 2.5-0.025 MG tablet Take 1-2 tablets by mouth 4 (four) times daily as needed for diarrhea or loose stools.   fluticasone (FLONASE) 50 MCG/ACT nasal spray SHAKE LIQUID AND USE 2 SPRAYS IN EACH NOSTRIL DAILY (Patient taking differently: Place 2 sprays into both nostrils daily.)   metoprolol tartrate (LOPRESSOR) 100 MG tablet Take 100 mg by mouth once. Take 2 hours prior to CT   nitroGLYCERIN (NITROSTAT) 0.4 MG SL tablet Place 1 tablet (0.4 mg total) under the tongue every 5  (five) minutes as needed for chest pain.   temazepam (RESTORIL) 15 MG capsule Take 1 capsule (15 mg total) by mouth at bedtime as needed for sleep.   [DISCONTINUED] metoprolol tartrate (LOPRESSOR) 100 MG tablet Take 2 hours before CT (Patient taking differently: Take 100 mg by mouth once. Take 2 hours before CT)   Current Facility-Administered Medications for the 02/24/21 encounter (Office Visit) with Park Liter, MD  Medication   0.9 %  sodium chloride infusion     Allergies:   Amoxicillin, Shellfish allergy, Penicillins, and Sulfa antibiotics   Social History   Socioeconomic History   Marital status: Married    Spouse name: Not on file   Number of children: 2   Years of education: Not on file   Highest education level: Not on file  Occupational History   Occupation: Self-Empolyed  Tobacco Use   Smoking status: Never   Smokeless tobacco: Never  Vaping Use   Vaping Use: Never used  Substance and Sexual Activity   Alcohol use: Yes    Alcohol/week: 4.0 standard drinks    Types: 2 Glasses of wine, 2 Cans of beer per week   Drug use: No   Sexual activity: Yes    Birth control/protection: Post-menopausal  Other Topics Concern   Not on file  Social History Narrative   Not on file   Social Determinants of Health   Financial Resource Strain: Not on file  Food Insecurity: Not on file  Transportation Needs: Not on file  Physical Activity: Not on file  Stress: Not on file  Social Connections: Not on file     Family History: The patient's family history includes Asthma in her father; Breast cancer in her mother; Cancer in her father and mother; Early death in her brother and sister; Heart disease in her father; Hyperlipidemia in her father and mother. ROS:   Please see the history of present illness.    All 14 point review of systems negative except as described per history of present illness.  EKGs/Labs/Other Studies Reviewed:    The following studies were reviewed  today:   EKG:  EKG is  ordered today.  The ekg ordered today demonstrates normal sinus rhythm, normal P interval, normal QS complex duration morphology except for low voltage.  Nonspecific ST segment changes  Recent Labs: 07/21/2020: ALT 13; BUN 21; Creat 0.90; Hemoglobin 16.7; Platelets 197; Potassium 4.4; Sodium 135  Recent Lipid Panel    Component Value Date/Time   CHOL 223 (H) 02/25/2020 1017   TRIG 99 02/25/2020 1017   HDL 54 02/25/2020 1017   CHOLHDL 4.1 02/25/2020 1017   VLDL 50 (H) 06/25/2015 1033   LDLCALC 148 (H) 02/25/2020 1017    Physical Exam:    VS:  BP 136/72 (BP Location: Right Arm, Patient Position: Sitting)  Pulse 77   Ht 5' 5"  (1.651 m)   Wt 157 lb (71.2 kg)   SpO2 97%   BMI 26.13 kg/m     Wt Readings from Last 3 Encounters:  02/24/21 157 lb (71.2 kg)  01/28/21 161 lb 6.4 oz (73.2 kg)  07/21/20 156 lb (70.8 kg)     GEN:  Well nourished, well developed in no acute distress HEENT: Normal NECK: No JVD; No carotid bruits LYMPHATICS: No lymphadenopathy CARDIAC: RRR, no murmurs, no rubs, no gallops RESPIRATORY:  Clear to auscultation without rales, wheezing or rhonchi  ABDOMEN: Soft, non-tender, non-distended MUSCULOSKELETAL:  No edema; No deformity  SKIN: Warm and dry NEUROLOGIC:  Alert and oriented x 3 PSYCHIATRIC:  Normal affect   ASSESSMENT:    1. Chest pain, unspecified type   2. Dyspnea, unspecified type   3. Precordial chest pain   4. Dyspnea on exertion   5. Mixed hyperlipidemia   6. Insomnia, unspecified type    PLAN:    In order of problems listed above:  Chest pain with some worrisome characteristics.  I asked her to start taking 1 baby aspirin every single day.  I gave her nitroglycerin as needed to take it when she developed pain.  Also asked her not to get to the point of pain will happen many it is okay for her to walk but not push herself to the level of pain.  We did talk about options to look at her coronary arteries in my  opinion the best option would be to do coronary CT angio.  She is scheduled to have the test.  I told her if she developed chest pain that is not relieved by nitroglycerin she is to go to the emergency room. Dyslipidemia she is on statin which I will continue however in the future will most likely will switch to a different statin hoping that we will be able to manage this problem better but first I would like to have her coronary CT angio. Dyspnea on exertion: I will schedule her to have echocardiogram to assess left ventricle ejection fraction.  There is some suspicion about potentially having sleep apnea therefore it will be beneficial also to look at the right ventricle one of the EKG did I pull out from the chart to possibility of right ventricular enlargement.  Again that need to be assessed by doing echocardiogram Insomnia: And is being aggressively investigated.  She is scheduled to have sleep study.   Medication Adjustments/Labs and Tests Ordered: Current medicines are reviewed at length with the patient today.  Concerns regarding medicines are outlined above.  Orders Placed This Encounter  Procedures   CT CORONARY MORPH W/CTA COR W/SCORE W/CA W/CM &/OR WO/CM   EKG 12-Lead   ECHOCARDIOGRAM COMPLETE   Meds ordered this encounter  Medications   aspirin EC 81 MG tablet    Sig: Take 1 tablet (81 mg total) by mouth daily. Swallow whole.    Dispense:  30 tablet    Refill:  6   nitroGLYCERIN (NITROSTAT) 0.4 MG SL tablet    Sig: Place 1 tablet (0.4 mg total) under the tongue every 5 (five) minutes as needed for chest pain.    Dispense:  25 tablet    Refill:  6   DISCONTD: metoprolol tartrate (LOPRESSOR) 100 MG tablet    Sig: Take 2 hours before CT    Dispense:  1 tablet    Refill:  0    Signed, Park Liter, MD, [facility].  02/24/2021 11:06 AM    [place]

## 2021-02-24 NOTE — Patient Instructions (Signed)
Medication Instructions:  Your physician has recommended you make the following change in your medication:  Start Baby Aspirin 61m daily Pick up Nitroglycerine at pharmacy to be used as needed for chest pain  *If you need a refill on your cardiac medications before your next appointment, please call your pharmacy*   Lab Work: NONE If you have labs (blood work) drawn today and your tests are completely normal, you will receive your results only by: [facility](if you have MyChart) OR A paper copy in the mail If you have any lab test that is abnormal or we need to change your treatment, we will call you to review the results.   Testing/Procedures: [facility]xx [facility] (30-45 minutes prior to test start time)  [address] [phone] [facility] ([facility]).  Please follow these instructions carefully (unless otherwise directed):    On the Night Before the Test: Drink plenty of water. Do not consume any caffeinated/decaffeinated beverages or chocolate 12 hours prior to your test. Do not take any antihistamines 12 hours prior to your test. If you take Metformin do not take 24 hours prior to test. If the patient has contrast allergy: Patient will need a prescription for Prednisone and very clear instructions (as follows): Prednisone 50 mg - take 13 hours prior to test Take another Prednisone 50 mg 7 hours prior to test Take another Prednisone 50 mg 1 hour prior to test Take Benadryl 50 mg 1 hour prior to test Patient must complete all four doses of above prophylactic medications. Patient will need a ride after test due to Benadryl.  On the Day of the Test: Drink plenty of water. Do not drink any water within one hour of the test. Do not eat any food 4 hours prior to the test. You may take your regular medications  prior to the test. IF NOT ON A BETA BLOCKER - Take 100 mg of lopressor (metoprolol) two hour before the test.   After the Test: Drink plenty of water. After receiving IV contrast, you may experience a mild flushed feeling. This is normal. On occasion, you may experience a mild rash up to 24 hours after the test. This is not dangerous. If this occurs, you can take Benadryl 25 mg and increase your fluid intake. If you experience trouble breathing, this can be serious. If it is severe call 911 IMMEDIATELY. If it is mild, please call our office. If you take any of these medications: Glipizide/Metformin, Avandament, Glucavance, please do not take 48 hours after completing test.  Your physician has requested that you have an echocardiogram. Echocardiography is a painless test that uses sound waves to create images of your heart. It provides your doctor with information about the size and shape of your heart and how well your heart's chambers and valves are working. This procedure takes approximately one hour. There are no restrictions for this procedure.   Follow-Up: [facility] are our priority.  As part of our continuing mission to provide you with exceptional heart care, we have created designated Provider Care Teams.  These Care Teams include your primary Cardiologist (physician) and Advanced Practice Providers (APPs -  Physician Assistants and Nurse Practitioners) who all work together to provide you with the care you need, when you need it.  We recommend signing up  for the patient portal called "MyChart".  Sign up information is provided on this After Visit Summary.  MyChart is used to connect with patients for Virtual Visits (Telemedicine).  Patients are able to view lab/test results, encounter notes, upcoming appointments, etc.  Non-urgent messages can be sent to your provider as well.   To learn more about what you can do with MyChart, go to  [email or web address].    Your next appointment:   6 week(s)  The format for your next appointment:   In Person  Provider:   Jenne Campus, MD   Other Instructions  Echocardiogram An echocardiogram is a test that uses sound waves (ultrasound) to produce images of the heart. Images from an echocardiogram can provide important information about: Heart size and shape. The size and thickness and movement of your heart's walls. Heart muscle function and strength. Heart valve function or if you have stenosis. Stenosis is when the heart valves are too narrow. If blood is flowing backward through the heart valves (regurgitation). A tumor or infectious growth around the heart valves. Areas of heart muscle that are not working well because of poor blood flow or injury from a heart attack. Aneurysm detection. An aneurysm is a weak or damaged part of an artery wall. The wall bulges out from the normal force of blood pumping through the body. Tell a health care provider about: Any allergies you have. All medicines you are taking, including vitamins, herbs, eye drops, creams, and over-the-counter medicines. Any blood disorders you have. Any surgeries you have had. Any medical conditions you have. Whether you are pregnant or may be pregnant. What are the risks? Generally, this is a safe test. However, problems may occur, including an allergic reaction to dye (contrast) that may be used during the test. What happens before the test? No specific preparation is needed. You may eat and drink normally. What happens during the test?  You will take off your clothes from the waist up and put on a hospital gown. Electrodes or electrocardiogram (ECG)patches may be placed on your chest. The electrodes or patches are then connected to a device that monitors your heart rate and rhythm. You will lie down on a table for an ultrasound exam. A gel will be applied to your chest to help sound waves pass  through your skin. A handheld device, called a transducer, will be pressed against your chest and moved over your heart. The transducer produces sound waves that travel to your heart and bounce back (or "echo" back) to the transducer. These sound waves will be captured in real-time and changed into images of your heart that can be viewed on a video monitor. The images will be recorded on a computer and reviewed by your health care provider. You may be asked to change positions or hold your breath for a short time. This makes it easier to get different views or better views of your heart. In some cases, you may receive contrast through an IV in one of your veins. This can improve the quality of the pictures from your heart. The procedure may vary among health care providers and hospitals. What can I expect after the test? You may return to your normal, everyday life, including diet, activities, andmedicines, unless your health care provider tells you not to do that. Follow these instructions at home: It is up to you to get the results of your test. Ask your health care provider, or the department that is doing the test, when your  results will be ready. Keep all follow-up visits. This is important. Summary An echocardiogram is a test that uses sound waves (ultrasound) to produce images of the heart. Images from an echocardiogram can provide important information about the size and shape of your heart, heart muscle function, heart valve function, and other possible heart problems. You do not need to do anything to prepare before this test. You may eat and drink normally. After the echocardiogram is completed, you may return to your normal, everyday life, unless your health care provider tells you not to do that. This information is not intended to replace advice given to you by your health care provider. Make sure you discuss any questions you have with your healthcare provider. Document Revised:  04/15/2020 Document Reviewed: 04/15/2020 Elsevier Patient Education  [place]. Cardiac CT Angiogram A cardiac CT angiogram is a procedure to look at the heart and the area around the heart. It may be done to help find the cause of chest pains or other symptoms of heart disease. During this procedure, a substance called contrast dye is injected into the blood vessels in the area to be checked. A large X-ray machine, called a CT scanner, then takes detailed pictures of the heart and the surrounding area. The procedure is also sometimes called a coronary CTangiogram, coronary artery scanning, or CTA. A cardiac CT angiogram allows the health care provider to see how well blood is flowing to and from the heart. The health care provider will be able to see if there are any problems, such as: Blockage or narrowing of the coronary arteries in the heart. Fluid around the heart. Signs of weakness or disease in the muscles, valves, and tissues of the heart. Tell a health care provider about: Any allergies you have. This is especially important if you have had a previous allergic reaction to contrast dye. All medicines you are taking, including vitamins, herbs, eye drops, creams, and over-the-counter medicines. Any blood disorders you have. Any surgeries you have had. Any medical conditions you have. Whether you are pregnant or may be pregnant. Any anxiety disorders, chronic pain, or other conditions you have that may increase your stress or prevent you from lying still. What are the risks? Generally, this is a safe procedure. However, problems may occur, including: Bleeding. Infection. Allergic reactions to medicines or dyes. Damage to other structures or organs. Kidney damage from the contrast dye that is used. Increased risk of cancer from radiation exposure. This risk is low. Talk with your health care provider about: The risks and benefits of testing. How you can receive the lowest dose  of radiation. What happens before the procedure? Wear comfortable clothing and remove any jewelry, glasses, dentures, and hearing aids. Follow instructions from your health care provider about eating and drinking. This may include: For 12 hours before the procedure -- avoid caffeine. This includes tea, coffee, soda, energy drinks, and diet pills. Drink plenty of water or other fluids that do not have caffeine in them. Being well hydrated can prevent complications. For 4-6 hours before the procedure -- stop eating and drinking. The contrast dye can cause nausea, but this is less likely if your stomach is empty. Ask your health care provider about changing or stopping your regular medicines. This is especially important if you are taking diabetes medicines, blood thinners, or medicines to treat problems with erections (erectile dysfunction). What happens during the procedure?  Hair on your chest may need to be removed so that small sticky  patches called electrodes can be placed on your chest. These will transmit information that helps to monitor your heart during the procedure. An IV will be inserted into one of your veins. You might be given a medicine to control your heart rate during the procedure. This will help to ensure that good images are obtained. You will be asked to lie on an exam table. This table will slide in and out of the CT machine during the procedure. Contrast dye will be injected into the IV. You might feel warm, or you may get a metallic taste in your mouth. You will be given a medicine called nitroglycerin. This will relax or dilate the arteries in your heart. The table that you are lying on will move into the CT machine tunnel for the scan. The person running the machine will give you instructions while the scans are being done. You may be asked to: Keep your arms above your head. Hold your breath. Stay very still, even if the table is moving. When the scanning is complete,  you will be moved out of the machine. The IV will be removed. The procedure may vary among health care providers and hospitals. What can I expect after the procedure? After your procedure, it is common to have: A metallic taste in your mouth from the contrast dye. A feeling of warmth. A headache from the nitroglycerin. Follow these instructions at home: Take over-the-counter and prescription medicines only as told by your health care provider. If you are told, drink enough fluid to keep your urine pale yellow. This will help to flush the contrast dye out of your body. Most people can return to their normal activities right after the procedure. Ask your health care provider what activities are safe for you. It is up to you to get the results of your procedure. Ask your health care provider, or the department that is doing the procedure, when your results will be ready. Keep all follow-up visits as told by your health care provider. This is important. Contact a health care provider if: You have any symptoms of allergy to the contrast dye. These include: Shortness of breath. Rash or hives. A racing heartbeat. Summary A cardiac CT angiogram is a procedure to look at the heart and the area around the heart. It may be done to help find the cause of chest pains or other symptoms of heart disease. During this procedure, a large X-ray machine, called a CT scanner, takes detailed pictures of the heart and the surrounding area after a contrast dye has been injected into blood vessels in the area. Ask your health care provider about changing or stopping your regular medicines before the procedure. This is especially important if you are taking diabetes medicines, blood thinners, or medicines to treat erectile dysfunction. If you are told, drink enough fluid to keep your urine pale yellow. This will help to flush the contrast dye out of your body. This information is not intended to replace advice given  to you by your health care provider. Make sure you discuss any questions you have with your healthcare provider. Document Revised: 04/18/2019 Document Reviewed: 04/18/2019 Elsevier Patient Education  [place].

## 2021-03-03 ENCOUNTER — Telehealth: Payer: Self-pay | Admitting: Cardiology

## 2021-03-03 NOTE — Telephone Encounter (Signed)
Paula Beasley is calling requesting a provider switch from Dr. Agustin Cree to Dr. Radford Pax. She states it is due to originally being referred urgently to Dr. Radford Pax for general card and sleep, but a sooner appt being available with Dr. Agustin Cree due to it being urgent. Please advise.

## 2021-03-06 ENCOUNTER — Encounter: Payer: Self-pay | Admitting: Nurse Practitioner

## 2021-03-06 DIAGNOSIS — G4700 Insomnia, unspecified: Secondary | ICD-10-CM

## 2021-03-12 ENCOUNTER — Other Ambulatory Visit (HOSPITAL_COMMUNITY): Payer: Self-pay | Admitting: *Deleted

## 2021-03-12 ENCOUNTER — Encounter (HOSPITAL_COMMUNITY): Payer: Self-pay

## 2021-03-12 ENCOUNTER — Telehealth (HOSPITAL_COMMUNITY): Payer: Self-pay | Admitting: *Deleted

## 2021-03-12 DIAGNOSIS — Z01812 Encounter for preprocedural laboratory examination: Secondary | ICD-10-CM

## 2021-03-12 NOTE — Addendum Note (Signed)
Addended by: Eli Hose on: 03/12/2021 03:28 PM   Modules accepted: Orders

## 2021-03-12 NOTE — Telephone Encounter (Signed)
Reaching out to patient to offer assistance regarding upcoming cardiac imaging study; pt verbalizes understanding of appt date/time, parking situation and where to check in, pre-test NPO status and medications ordered, and verified current allergies; name and call back number provided for further questions should they arise  Gordy Clement RN Navigator Cardiac [place] [facility] [phone] office [phone] cell  Patient to take 162m metoprolol tartrate 2 hours prior to cardiac CT scan.

## 2021-03-13 ENCOUNTER — Other Ambulatory Visit: Payer: Self-pay

## 2021-03-13 ENCOUNTER — Other Ambulatory Visit (HOSPITAL_BASED_OUTPATIENT_CLINIC_OR_DEPARTMENT_OTHER): Admission: RE | Admit: 2021-03-13 | Payer: No Typology Code available for payment source | Source: Ambulatory Visit

## 2021-03-13 ENCOUNTER — Encounter (HOSPITAL_BASED_OUTPATIENT_CLINIC_OR_DEPARTMENT_OTHER): Payer: Self-pay

## 2021-03-13 DIAGNOSIS — R0600 Dyspnea, unspecified: Secondary | ICD-10-CM

## 2021-03-13 DIAGNOSIS — R0609 Other forms of dyspnea: Secondary | ICD-10-CM

## 2021-03-13 DIAGNOSIS — R072 Precordial pain: Secondary | ICD-10-CM

## 2021-03-14 LAB — BASIC METABOLIC PANEL
BUN/Creatinine Ratio: 14 (ref 12–28)
BUN: 13 mg/dL (ref 8–27)
CO2: 25 mmol/L (ref 20–29)
Calcium: 9.2 mg/dL (ref 8.7–10.3)
Chloride: 101 mmol/L (ref 96–106)
Creatinine, Ser: 0.95 mg/dL (ref 0.57–1.00)
Glucose: 97 mg/dL (ref 65–99)
Potassium: 4.3 mmol/L (ref 3.5–5.2)
Sodium: 138 mmol/L (ref 134–144)
eGFR: 68 mL/min/{1.73_m2} (ref >59)

## 2021-03-16 ENCOUNTER — Other Ambulatory Visit: Payer: Self-pay

## 2021-03-16 ENCOUNTER — Ambulatory Visit (HOSPITAL_COMMUNITY)
Admission: RE | Admit: 2021-03-16 | Discharge: 2021-03-16 | Disposition: A | Discharge status: Home / Self Care | Payer: No Typology Code available for payment source | Source: Ambulatory Visit | Attending: Cardiology | Admitting: Cardiology

## 2021-03-16 ENCOUNTER — Ambulatory Visit (HOSPITAL_BASED_OUTPATIENT_CLINIC_OR_DEPARTMENT_OTHER): Payer: No Typology Code available for payment source

## 2021-03-16 DIAGNOSIS — R079 Chest pain, unspecified: Secondary | ICD-10-CM | POA: Diagnosis not present

## 2021-03-16 DIAGNOSIS — R0600 Dyspnea, unspecified: Secondary | ICD-10-CM

## 2021-03-16 LAB — ECHOCARDIOGRAM COMPLETE
Area-P 1/2: 2.84 cm2
S' Lateral: 1.8 cm

## 2021-03-16 MED ORDER — NITROGLYCERIN 0.4 MG SL SUBL
SUBLINGUAL_TABLET | SUBLINGUAL | Status: AC
  Filled 2021-03-16: qty 2

## 2021-03-16 MED ORDER — NITROGLYCERIN 0.4 MG SL SUBL
0.8000 mg | SUBLINGUAL_TABLET | Freq: Once | SUBLINGUAL | Status: AC
  Administered 2021-03-16: 0.8 mg via SUBLINGUAL

## 2021-03-16 MED ORDER — IOHEXOL 350 MG/ML SOLN
95.0000 mL | Freq: Once | INTRAVENOUS | Status: AC | PRN
  Administered 2021-03-16: 95 mL via INTRAVENOUS

## 2021-03-16 NOTE — Progress Notes (Signed)
CT scan completed. Tolerated well. D/C home ambulatory, awake and alert. In no distress.

## 2021-03-17 ENCOUNTER — Other Ambulatory Visit: Payer: Self-pay | Admitting: Family Medicine

## 2021-03-17 ENCOUNTER — Encounter: Payer: Self-pay | Admitting: Nurse Practitioner

## 2021-03-17 MED ORDER — PANTOPRAZOLE SODIUM 40 MG PO TBEC: 40.0000 mg | DELAYED_RELEASE_TABLET | Freq: Every day | ORAL | 3 refills | Status: DC

## 2021-03-25 DIAGNOSIS — U071 COVID-19: Secondary | ICD-10-CM

## 2021-03-25 HISTORY — DX: COVID-19: U07.1

## 2021-03-30 ENCOUNTER — Encounter: Payer: Self-pay | Admitting: Nurse Practitioner

## 2021-04-14 ENCOUNTER — Other Ambulatory Visit: Payer: Self-pay

## 2021-04-14 DIAGNOSIS — C801 Malignant (primary) neoplasm, unspecified: Secondary | ICD-10-CM | POA: Insufficient documentation

## 2021-04-15 ENCOUNTER — Ambulatory Visit: Payer: No Typology Code available for payment source | Admitting: Cardiology

## 2021-04-15 ENCOUNTER — Encounter: Payer: Self-pay | Admitting: Cardiology

## 2021-04-15 ENCOUNTER — Other Ambulatory Visit: Payer: Self-pay

## 2021-04-15 VITALS — BP 108/62 | HR 93 | Ht 64.0 in | Wt 157.0 lb

## 2021-04-15 DIAGNOSIS — E782 Mixed hyperlipidemia: Secondary | ICD-10-CM | POA: Diagnosis not present

## 2021-04-15 DIAGNOSIS — R0609 Other forms of dyspnea: Secondary | ICD-10-CM

## 2021-04-15 DIAGNOSIS — R072 Precordial pain: Secondary | ICD-10-CM

## 2021-04-15 DIAGNOSIS — R0600 Dyspnea, unspecified: Secondary | ICD-10-CM

## 2021-04-15 MED ORDER — ROSUVASTATIN CALCIUM 10 MG PO TABS: 10.0000 mg | ORAL_TABLET | Freq: Every day | ORAL | 3 refills | Status: DC

## 2021-04-15 NOTE — Progress Notes (Signed)
Cardiology Office Note:    Date:  04/15/2021   ID:  Paula Beasley, DOB 25-Dec-1960, MRN [id number]  PCP:  Pcp, No  Cardiologist:  Jenne Campus, MD    Referring MD: Alycia Rossetti, MD   Chief Complaint  Patient presents with   Medication Management    History of Present Illness:    Paula Beasley is a 60 y.o. female with past medical history significant for dyslipidemia with difficulty tolerating on Lipitor, essential hypertension.  She was referred to [place] because of atypical chest pain.  Coronary CT angio has been performed which showed only minimal disease.  In the meantime she feels that her primary care physician initiated therapy for GERD with very good relief.  She is doing well denies have any chest pain tightness squeezing pressure burning chest.  Past Medical History:  Diagnosis Date   Allergy    Asthma    Cancer ([place])    skin   COVID 03/25/2021   Hyperlipidemia     Past Surgical History:  Procedure Laterality Date   APPENDECTOMY     60yo    Current Medications: Current Meds  Medication Sig   albuterol (VENTOLIN HFA) 108 (90 Base) MCG/ACT inhaler INHALE 2 PUFFS INTO THE LUNGS EVERY 6 HOURS AS NEEDED FOR WHEEZING OR SHORTNESS OF BREATH (Patient taking differently: Inhale 2 puffs into the lungs every 6 (six) hours as needed for wheezing or shortness of breath. INHALE 2 PUFFS INTO THE LUNGS EVERY 6 HOURS AS NEEDED FOR WHEEZING OR SHORTNESS OF BREATH)   atorvastatin (LIPITOR) 20 MG tablet Take 1 tablet (20 mg total) by mouth 3 (three) times a week.   budesonide-formoterol (SYMBICORT) 160-4.5 MCG/ACT inhaler Inhale 2 puffs into the lungs 2 (two) times daily. (Patient taking differently: Inhale 2 puffs into the lungs as needed (allergies). PRN- seasonal)   cetirizine (ZYRTEC) 10 MG tablet Take 10 mg by mouth daily as needed for allergies.   diphenhydrAMINE (BENADRYL) 25 MG tablet Take 25 mg by mouth every 6 (six) hours as needed for allergies.   diphenoxylate-atropine  (LOMOTIL) 2.5-0.025 MG tablet Take 1-2 tablets by mouth 4 (four) times daily as needed for diarrhea or loose stools.   fluticasone (FLONASE) 50 MCG/ACT nasal spray SHAKE LIQUID AND USE 2 SPRAYS IN EACH NOSTRIL DAILY (Patient taking differently: Place 2 sprays into both nostrils as needed for allergies.)   nitroGLYCERIN (NITROSTAT) 0.4 MG SL tablet Place 1 tablet (0.4 mg total) under the tongue every 5 (five) minutes as needed for chest pain.   pantoprazole (PROTONIX) 40 MG tablet Take 1 tablet (40 mg total) by mouth daily.   temazepam (RESTORIL) 15 MG capsule Take 1 capsule (15 mg total) by mouth at bedtime as needed for sleep.   Current Facility-Administered Medications for the 04/15/21 encounter (Office Visit) with Park Liter, MD  Medication   0.9 %  sodium chloride infusion     Allergies:   Amoxicillin, Shellfish allergy, Penicillins, and Sulfa antibiotics   Social History   Socioeconomic History   Marital status: Married    Spouse name: Not on file   Number of children: 2   Years of education: Not on file   Highest education level: Not on file  Occupational History   Occupation: Self-Emp[employer]  Tobacco Use   Smoking status: Never   Smokeless tobacco: Never  Vaping Use   Vaping Use: Never used  Substance and Sexual Activity   Alcohol use: Yes    Alcohol/week: 4.0 standard drinks  Types: 2 Glasses of wine, 2 Cans of beer per week   Drug use: No   Sexual activity: Yes    Birth control/protection: Post-menopausal  Other Topics Concern   Not on file  Social History Narrative   Not on file   Social Determinants of Health   Financial Resource Strain: Not on file  Food Insecurity: Not on file  Transportation Needs: Not on file  Physical Activity: Not on file  Stress: Not on file  Social Connections: Not on file     Family History: The patient's family history includes Asthma in her father; Breast cancer in her mother; Cancer in her father and mother; Early  death in her brother and sister; Heart disease in her father; Hyperlipidemia in her father and mother. ROS:   Please see the history of present illness.    All 14 point review of systems negative except as described per history of present illness  EKGs/Labs/Other Studies Reviewed:      Recent Labs: 07/21/2020: ALT 13; Hemoglobin 16.7; Platelets 197 03/13/2021: BUN 13; Creatinine, Ser 0.95; Potassium 4.3; Sodium 138  Recent Lipid Panel    Component Value Date/Time   CHOL 223 (H) 02/25/2020 1017   TRIG 99 02/25/2020 1017   HDL 54 02/25/2020 1017   CHOLHDL 4.1 02/25/2020 1017   VLDL 50 (H) 06/25/2015 1033   LDLCALC 148 (H) 02/25/2020 1017    Physical Exam:    VS:  BP 108/62 (BP Location: Right Arm, Patient Position: Sitting)   Pulse 93   Ht 5' 4"  (1.626 m)   Wt 157 lb (71.2 kg)   SpO2 98%   BMI 26.95 kg/m     Wt Readings from Last 3 Encounters:  04/15/21 157 lb (71.2 kg)  02/24/21 157 lb (71.2 kg)  01/28/21 161 lb 6.4 oz (73.2 kg)     GEN:  Well nourished, well developed in no acute distress HEENT: Normal NECK: No JVD; No carotid bruits LYMPHATICS: No lymphadenopathy CARDIAC: RRR, no murmurs, no rubs, no gallops RESPIRATORY:  Clear to auscultation without rales, wheezing or rhonchi  ABDOMEN: Soft, non-tender, non-distended MUSCULOSKELETAL:  No edema; No deformity  SKIN: Warm and dry LOWER EXTREMITIES: no swelling NEUROLOGIC:  Alert and oriented x 3 PSYCHIATRIC:  Normal affect   ASSESSMENT:    1. Precordial chest pain   2. Dyspnea on exertion   3. Mixed hyperlipidemia    PLAN:    In order of problems listed above:  Coronary disease only minimal on coronary CT angio.  I encouraged exercises and encouraged to be active and good diet. Dyspnea on exertion doing well from that point review encouraged her to be a little more active. Mixed dyslipidemia I did review her K PN which show me her LDL of 148 and HDL of 54 she is taking Lipitor 23 times a day.  However I  suggested to switch to Crestor 10 mg daily if she is unable to tolerate it then we will switch to pravastatin. I will see her back in my office in about 1 year or sooner if she got a problem I scheduled with her fasting lipid profile done in 6 weeks   Medication Adjustments/Labs and Tests Ordered: Current medicines are reviewed at length with the patient today.  Concerns regarding medicines are outlined above.  No orders of the defined types were placed in this encounter.  Medication changes: No orders of the defined types were placed in this encounter.   Signed, Park Liter, MD, [facility]  04/15/2021 11:31 AM    [facility]

## 2021-04-15 NOTE — Patient Instructions (Signed)
Medication Instructions:  Your physician has recommended you make the following change in your medication:  STOP: Lipitor  START: Crestor 10 mg once daily *If you need a refill on your cardiac medications before your next appointment, please call your pharmacy*   Lab Work: Your physician recommends that you return for lab work in:  In 6 weeks: Lipids, AST, ALT  If you have labs (blood work) drawn today and your tests are completely normal, you will receive your results only by: [place] (if you have MyChart) OR A paper copy in the mail If you have any lab test that is abnormal or we need to change your treatment, we will call you to review the results.   Testing/Procedures: None   Follow-Up: At [facility], you and your health needs are our priority.  As part of our continuing mission to provide you with exceptional heart care, we have created designated Provider Care Teams.  These Care Teams include your primary Cardiologist (physician) and Advanced Practice Providers (APPs -  Physician Assistants and Nurse Practitioners) who all work together to provide you with the care you need, when you need it.  We recommend signing up for the patient portal called "MyChart".  Sign up information is provided on this After Visit Summary.  MyChart is used to connect with patients for Virtual Visits (Telemedicine).  Patients are able to view lab/test results, encounter notes, upcoming appointments, etc.  Non-urgent messages can be sent to your provider as well.   To learn more about what you can do with MyChart, go to [email or web address].    Your next appointment:   1 year(s)  The format for your next appointment:   In Person  Provider:   Jenne Campus, MD   Other Instructions

## 2021-05-19 ENCOUNTER — Institutional Professional Consult (permissible substitution): Payer: No Typology Code available for payment source | Admitting: Neurology

## 2021-07-07 ENCOUNTER — Ambulatory Visit: Payer: No Typology Code available for payment source | Admitting: Neurology

## 2021-07-07 ENCOUNTER — Encounter: Payer: Self-pay | Admitting: Neurology

## 2021-07-07 VITALS — BP 128/73 | HR 73 | Ht 64.0 in | Wt 160.8 lb

## 2021-07-07 DIAGNOSIS — G478 Other sleep disorders: Secondary | ICD-10-CM

## 2021-07-07 DIAGNOSIS — G4700 Insomnia, unspecified: Secondary | ICD-10-CM

## 2021-07-07 DIAGNOSIS — E663 Overweight: Secondary | ICD-10-CM

## 2021-07-07 DIAGNOSIS — R5383 Other fatigue: Secondary | ICD-10-CM

## 2021-07-07 DIAGNOSIS — R0683 Snoring: Secondary | ICD-10-CM | POA: Diagnosis not present

## 2021-07-07 NOTE — Patient Instructions (Signed)
Based on your symptoms and your exam I believe we should look for an underlying organic sleep disorder, such as obstructive sleep apnea (OSA) with a sleep study.  If you have more than mild OSA, I will likely ask you to consider treatment with a CPAP or so-called autoPAP machine. Please remember, the risks and ramifications of moderate to severe obstructive sleep apnea or OSA are: Cardiovascular disease, including congestive heart failure, stroke, difficult to control hypertension, arrhythmias, and even type 2 diabetes has been linked to untreated OSA. Sleep apnea causes disruption of sleep and sleep deprivation in most cases, which, in turn, can cause recurrent headaches, problems with memory, mood, concentration, focus, and vigilance. Most people with untreated sleep apnea report excessive daytime sleepiness, which can affect their ability to drive. Please do not drive if you feel sleepy.   We will call you after your sleep study to advise about the results.   Our sleep lab administrative assistant, will call you to schedule your sleep study. If you don't hear back from her by about 2 weeks from now, please feel free to call her at [phone]. This is her direct line and please leave a message with your phone number to call back if you get the voicemail box. She will call back as soon as possible.   Your chronic insomnia may be treated with cognitive behavioral therapy (CBT-I). Please talk to your primary care physician about a possible referral to psychology.

## 2021-07-07 NOTE — Progress Notes (Addendum)
Subjective:    Patient ID: Paula Beasley is a 60 y.o. female.  HPI    Star Age, MD, PhD [facility] [address], [address] [place], [place] [address]  Dear [name],  I saw your patient, Paula Beasley, upon your kind request in my sleep clinic today for initial consultation of her sleep disorder, in particular, concern for underlying obstructive sleep apnea.  The patient is unaccompanied today.  As you know, [name] is a 60 year old right-handed woman with an underlying medical history of hyperlipidemia, allergies, asthma, reflux disease, chest pain, skin cancer and mildly overweight state, who reports lack of energy and feeling tired for years.  She has chronic difficulty maintaining sleep, no significant difficulty initiating sleep.  She has tried over-the-counter medications including melatonin, sleep 3, and other supplements.  She has tried temazepam recently, up to 15 mg at night, and it did not help.  She snores occasionally per husband's feedback.  She does not wake up gasping for air.  She denies telltale symptoms of restless leg syndrome.  She has not been told that she twitches her feet or kicks her legs while asleep.  She has not been noted to have pauses in her breathing while asleep.  She denies recurrent morning headaches or night to night nocturia.  She tries to go to bed between 930 and 11 typically and rise time is generally between 630 and 7:30 AM.  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 She has been seeing Dr. Agustin Cree in cardiology. Her Epworth sleepiness score is 0 out of 24, fatigue severity score is 50 out of 63.  Symptoms started in perimenopause, she sometimes wakes up with a hot flash but not night sweats.  She drinks caffeine in the form of coffee, 2 mugs in the mornings.   She recently started taking a PPI for reflux disease. She drinks alcohol occasionally in the form of wine.  She is a non-smoker.  They have  a dog in the household, the dog sleeps in the bedroom but not on the bed with them.  She does have a TV in the bedroom but does not watch it at night, she likes to read before falling asleep.  She will wake up in the middle of the night and may stay awake for minutes at a time or sometimes up to an hour and a half.  She may start reading if she cannot go back to sleep.  She typically stays in bed.  Her Past Medical History Is Significant For: Past Medical History:  Diagnosis Date   Allergy    Asthma    Cancer ([place])    skin   COVID 03/25/2021   Hyperlipidemia     Her Past Surgical History Is Significant For: Past Surgical History:  Procedure Laterality Date   APPENDECTOMY     60yo    Her Family History Is Significant For: Family History  Problem Relation Age of Onset   Hyperlipidemia Mother    Cancer Mother        metatastic breast cancer   Breast cancer Mother    Asthma Father    Hyperlipidemia Father    Heart disease Father    Cancer Father        Prostate   Early death Sister        after birth    Early death Brother        after birth    Insomnia Neg  Hx     Her Social History Is Significant For: Social History   Socioeconomic History   Marital status: Married    Spouse name: Not on file   Number of children: 2   Years of education: Not on file   Highest education level: Not on file  Occupational History   Occupation: Self-Emp[employer]  Tobacco Use   Smoking status: Never   Smokeless tobacco: Never  Vaping Use   Vaping Use: Never used  Substance and Sexual Activity   Alcohol use: Yes    Alcohol/week: 4.0 standard drinks    Types: 2 Glasses of wine, 2 Cans of beer per week   Drug use: No   Sexual activity: Yes    Birth control/protection: Post-menopausal  Other Topics Concern   Not on file  Social History Narrative   Not on file   Social Determinants of Health   Financial Resource Strain: Not on file  Food Insecurity: Not on file  Transportation  Needs: Not on file  Physical Activity: Not on file  Stress: Not on file  Social Connections: Not on file    Her Allergies Are:  Allergies  Allergen Reactions   Amoxicillin Rash   Shellfish Allergy    Penicillins Rash   Sulfa Antibiotics Rash  :   Her Current Medications Are:  Outpatient Encounter Medications as of 07/07/2021  Medication Sig   albuterol (VENTOLIN HFA) 108 (90 Base) MCG/ACT inhaler INHALE 2 PUFFS INTO THE LUNGS EVERY 6 HOURS AS NEEDED FOR WHEEZING OR SHORTNESS OF BREATH (Patient taking differently: Inhale 2 puffs into the lungs every 6 (six) hours as needed for wheezing or shortness of breath. INHALE 2 PUFFS INTO THE LUNGS EVERY 6 HOURS AS NEEDED FOR WHEEZING OR SHORTNESS OF BREATH)   budesonide-formoterol (SYMBICORT) 160-4.5 MCG/ACT inhaler Inhale 2 puffs into the lungs 2 (two) times daily. (Patient taking differently: Inhale 2 puffs into the lungs as needed (allergies). PRN- seasonal)   cetirizine (ZYRTEC) 10 MG tablet Take 10 mg by mouth daily as needed for allergies.   diphenhydrAMINE (BENADRYL) 25 MG tablet Take 25 mg by mouth every 6 (six) hours as needed for allergies.   diphenoxylate-atropine (LOMOTIL) 2.5-0.025 MG tablet Take 1-2 tablets by mouth 4 (four) times daily as needed for diarrhea or loose stools.   fluticasone (FLONASE) 50 MCG/ACT nasal spray SHAKE LIQUID AND USE 2 SPRAYS IN EACH NOSTRIL DAILY (Patient taking differently: Place 2 sprays into both nostrils as needed for allergies.)   pantoprazole (PROTONIX) 40 MG tablet Take 1 tablet (40 mg total) by mouth daily.   rosuvastatin (CRESTOR) 10 MG tablet Take 1 tablet (10 mg total) by mouth daily.   temazepam (RESTORIL) 15 MG capsule Take 1 capsule (15 mg total) by mouth at bedtime as needed for sleep.   nitroGLYCERIN (NITROSTAT) 0.4 MG SL tablet Place 1 tablet (0.4 mg total) under the tongue every 5 (five) minutes as needed for chest pain.   Facility-Administered Encounter Medications as of 07/07/2021   Medication   0.9 %  sodium chloride infusion  :   Review of Systems:  Out of a complete 14 point review of systems, all are reviewed and negative with the exception of these symptoms as listed below:  Review of Systems  Neurological:        Pt is here for insomnia. Pt states she has had insomnia for 12 years. Pt states she sleep for 4 hours and than wake up. Pt states she does not stay asleep for a  long period of time.Pt states she is fatigue throughout the day. Pt states she has tried different medications to no success.  FSS:50 ESS:0   Objective:  Neurological Exam  Physical Exam Physical Examination:   Vitals:   07/07/21 1016  BP: 128/73  Pulse: 73    General Examination: The patient is a very pleasant 60 y.o. female in no acute distress. She appears well-developed and well-nourished and well groomed.   HEENT: Normocephalic, atraumatic, pupils are equal, round and reactive, extraocular tracking well-preserved. Hearing is grossly intact. Face is symmetric with normal facial animation. Speech is clear with no dysarthria noted. There is no hypophonia. There is no lip, neck/head, jaw or voice tremor. Neck is supple with full range of passive and active motion. There are no carotid bruits on auscultation. Oropharynx exam reveals: mild mouth dryness, adequate dental hygiene and mild airway crowding, due to small airway entry, tonsils small and uvula on the smaller side.  Mallampati class I.  Neck circumference of 14-1/2 inches.  Tongue protrudes centrally and palate elevates symmetrically.   Chest: Clear to auscultation without wheezing, rhonchi or crackles noted.  Heart: S1+S2+0, regular and normal without murmurs, rubs or gallops noted.   Abdomen: Soft, non-tender and non-distended.  Extremities: There is no obvious edema in the distal lower extremities bilaterally.   Skin: Warm and dry without trophic changes noted.   Musculoskeletal: exam reveals no obvious joint  deformities.   Neurologically:  Mental status: The patient is awake, alert and oriented in all 4 spheres. Her immediate and remote memory, attention, language skills and fund of knowledge are appropriate. There is no evidence of aphasia, agnosia, apraxia or anomia. Speech is clear with normal prosody and enunciation. Thought process is linear. Mood is normal and affect is normal.  Cranial nerves II - XII are as described above under HEENT exam.  Motor exam: Normal bulk, strength and tone is noted. There is no tremor, fine motor skills and coordination: grossly intact.  Cerebellar testing: No dysmetria or intention tremor. There is no truncal or gait ataxia.  Sensory exam: intact to light touch in the upper and lower extremities.  Gait, station and balance: She stands easily. No veering to one side is noted. No leaning to one side is noted. Posture is age-appropriate and stance is narrow based. Gait shows normal stride length and normal pace. No problems turning are noted.   Assessment and Plan:  In summary, Kerith Sherley is a very pleasant 60 y.o.-year old female with an underlying medical history of hyperlipidemia, allergies, asthma, reflux disease, chest pain, skin cancer and mildly overweight state, who presents for recheck of her chronic sleep difficulty, in particular, difficulty maintaining sleep for years.  She has never had a sleep study.  She would be willing to get checked with a sleep study. I talked with the patient about sleep disturbances in particular sleep apnea which is the most common sleep disorder.  We talked about OSA, its prognosis and treatment options including surgical and nonsurgical options, we talked about inspire implanted device versus AutoPap or CPAP therapy.  We can pick up our discussion after testing.  Her history does not suggest telltale symptoms of restless leg syndrome.  She is advised to talk to you about her chronic insomnia with regards to management through  neuropsychology.  Cognitive behavioral therapy has been shown to be effective for chronic insomnia.  She is encouraged to talk to you about possible referral.  I outlined the difference between a laboratory  attended sleep study versus home sleep test.  We will seek insurance authorization and call her to schedule her test and keep her posted as to the results by phone call.  We will plan a follow-up in this clinic accordingly.  I answered all her questions today and she was in agreement. Thank you very much for allowing me to participate in the care of this nice patient. If I can be of any further assistance to you please do not hesitate to call me at [phone].  Sincerely,   Star Age, MD, PhD

## 2021-07-17 ENCOUNTER — Other Ambulatory Visit: Payer: Self-pay | Admitting: Family Medicine

## 2021-07-21 ENCOUNTER — Encounter: Payer: Self-pay | Admitting: Nurse Practitioner

## 2021-07-21 MED ORDER — PANTOPRAZOLE SODIUM 40 MG PO TBEC: 40.0000 mg | DELAYED_RELEASE_TABLET | Freq: Every day | ORAL | 0 refills | Status: AC

## 2021-08-12 ENCOUNTER — Ambulatory Visit (INDEPENDENT_AMBULATORY_CARE_PROVIDER_SITE_OTHER): Payer: No Typology Code available for payment source | Admitting: Neurology

## 2021-08-12 DIAGNOSIS — R5383 Other fatigue: Secondary | ICD-10-CM

## 2021-08-12 DIAGNOSIS — G4700 Insomnia, unspecified: Secondary | ICD-10-CM

## 2021-08-12 DIAGNOSIS — E663 Overweight: Secondary | ICD-10-CM

## 2021-08-12 DIAGNOSIS — G478 Other sleep disorders: Secondary | ICD-10-CM

## 2021-08-12 DIAGNOSIS — G4733 Obstructive sleep apnea (adult) (pediatric): Secondary | ICD-10-CM

## 2021-08-12 DIAGNOSIS — R0683 Snoring: Secondary | ICD-10-CM

## 2021-08-17 ENCOUNTER — Encounter: Payer: Self-pay | Admitting: Neurology

## 2021-08-18 NOTE — Progress Notes (Signed)
° °  [facility]  HOME SLEEP TEST (Watch PAT) REPORT  STUDY DATE: 08/12/2021  DOB: [birth year]  MRN: [id number]  ORDERING CLINICIAN: Star Age, MD, PhD   REFERRING CLINICIAN: Noemi Chapel, NP  CLINICAL INFORMATION/HISTORY: 60 year old woman with a history of hyperlipidemia, allergies, asthma, reflux disease, chest pain, skin cancer and mildly overweight state, who reports lack of energy and feeling tired for years.  She has chronic difficulty maintaining sleep, no significant difficulty initiating sleep.   Epworth sleepiness score: 0/24.  BMI: 27.5 kg/m  FINDINGS:   Sleep Summary:   Total Recording Time (hours, min): 7 hours, 55 minutes  Total Sleep Time (hours, min):  7 hours, 21 minutes   Percent REM (%):    26.4%   Respiratory Indices:   Calculated pAHI (per hour):  9.7/hour         REM pAHI:    24/hour       NREM pAHI: 4.6/hour  Oxygen Saturation Statistics:    Oxygen Saturation (%) Mean: 94%   Minimum oxygen saturation (%):                 86%   O2 Saturation Range (%): 86-99%    O2 Saturation (minutes) <=88%: 0.2 min  Pulse Rate Statistics:   Pulse Mean (bpm):    65/min    Pulse Range (46-94/min)   IMPRESSION: OSA (obstructive sleep apnea), mild  RECOMMENDATION:  This home sleep test demonstrates overall mild obstructive sleep apnea with a total AHI of 9.7/hour and O2 nadir of 86%.  Intermittent mild to moderate snoring was detected.  Given the patient's medical history and sleep related complaints, treatment with positive airway pressure is currently considered in the form of AutoPap therapy.  Alternative treatment options may include weight loss along with avoidance of the supine sleep position, or an oral appliance in appropriate candidates.   Please note, that untreated obstructive sleep apnea may carry additional perioperative morbidity. Patients with significant obstructive sleep apnea should receive perioperative PAP therapy  and the surgeons and particularly the anesthesiologist should be informed of the diagnosis and the severity of the sleep disordered breathing. The patient should be cautioned not to drive, work at heights, or operate dangerous or heavy equipment when tired or sleepy. Review and reiteration of good sleep hygiene measures should be pursued with any patient. Other causes of the patient's symptoms, including circadian rhythm disturbances, an underlying mood disorder, medication effect and/or an underlying medical problem cannot be ruled out based on this test. Clinical correlation is recommended.  The patient and her referring provider will be notified of the test results. The patient will be seen in follow up in sleep clinic at [facility].  I certify that I have reviewed the raw data recording prior to the issuance of this report in accordance with the standards of the American Academy of Sleep Medicine (AASM).  INTERPRETING PHYSICIAN:   Star Age, MD, PhD  Board Certified in Neurology and Sleep Medicine  [facility] [address] [place],  [address] [phone]

## 2021-08-21 NOTE — Procedures (Signed)
° °  [facility]  HOME SLEEP TEST (Watch PAT) REPORT  STUDY DATE: 08/12/2021  DOB: 1961/06/24  MRN: [id number]  ORDERING CLINICIAN: Star Age, MD, PhD   REFERRING CLINICIAN: Noemi Chapel, NP  CLINICAL INFORMATION/HISTORY: 60 year old woman with a history of hyperlipidemia, allergies, asthma, reflux disease, chest pain, skin cancer and mildly overweight state, who reports lack of energy and feeling tired for years.  She has chronic difficulty maintaining sleep, no significant difficulty initiating sleep.   Epworth sleepiness score: 0/24.  BMI: 27.5 kg/m  FINDINGS:   Sleep Summary:   Total Recording Time (hours, min): 7 hours, 55 minutes  Total Sleep Time (hours, min):  7 hours, 21 minutes   Percent REM (%):    26.4%   Respiratory Indices:   Calculated pAHI (per hour):  9.7/hour         REM pAHI:    24/hour       NREM pAHI: 4.6/hour  Oxygen Saturation Statistics:    Oxygen Saturation (%) Mean: 94%   Minimum oxygen saturation (%):                 86%   O2 Saturation Range (%): 86-99%    O2 Saturation (minutes) <=88%: 0.2 min  Pulse Rate Statistics:   Pulse Mean (bpm):    65/min    Pulse Range (46-94/min)   IMPRESSION: OSA (obstructive sleep apnea), mild  RECOMMENDATION:  This home sleep test demonstrates overall mild obstructive sleep apnea with a total AHI of 9.7/hour and O2 nadir of 86%.  Intermittent mild to moderate snoring was detected.  Given the patient's medical history and sleep related complaints, treatment with positive airway pressure is currently considered in the form of AutoPap therapy.  Alternative treatment options may include weight loss along with avoidance of the supine sleep position, or an oral appliance in appropriate candidates.   Please note, that untreated obstructive sleep apnea may carry additional perioperative morbidity. Patients with significant obstructive sleep apnea should receive perioperative PAP therapy  and the surgeons and particularly the anesthesiologist should be informed of the diagnosis and the severity of the sleep disordered breathing. The patient should be cautioned not to drive, work at heights, or operate dangerous or heavy equipment when tired or sleepy. Review and reiteration of good sleep hygiene measures should be pursued with any patient. Other causes of the patient's symptoms, including circadian rhythm disturbances, an underlying mood disorder, medication effect and/or an underlying medical problem cannot be ruled out based on this test. Clinical correlation is recommended.  The patient and her referring provider will be notified of the test results. The patient will be seen in follow up in sleep clinic at [facility].  I certify that I have reviewed the raw data recording prior to the issuance of this report in accordance with the standards of the American Academy of Sleep Medicine (AASM).  INTERPRETING PHYSICIAN:   Star Age, MD, PhD  Board Certified in Neurology and Sleep Medicine  [facility] [address] [place], [place] [address] [phone]

## 2021-08-25 ENCOUNTER — Telehealth: Payer: Self-pay

## 2021-08-25 NOTE — Telephone Encounter (Signed)
-----   Message from Star Age, MD sent at 08/21/2021 11:09 AM EST ----- Please call patient and advise her that her home sleep test showed mild to moderate snoring intermittently. She has mild obstructive sleep apnea, treatment with a machine like AutoPap is a possibility but is optional. I typically recommend treatment to patients who have symptoms regarding their sleep including lack of energy, tiredness, difficulty sleeping at night.  If agreeable, I would like to send an order for an AutoPap machine.  She will then get a machine through a DME company locally.  She will get supplies on a regular basis from them as well and they will charge her insurance.  She will need to use her machine consistently to fulfill insurance criteria typically.  We will have to see her in follow-up within 1 to 3 months after starting AutoPap therapy.  Alternative treatments include a dental device through her dentist, not all dentists provide treatment for sleep apnea.  We can make a referral if she would prefer getting evaluated for an oral appliance which is a mouth appliance to treat sleep apnea.  Treatment with a dental device is not always covered by insurance.  Surgical treatments are not recommended for mild sleep apnea.  Some weight loss can help with reducing snoring and improving sleep apnea.  Let me know how she would like to proceed.  If she is agreeable to starting AutoPap, you can order AutoPap with a minimum pressure of 4 cm, maximum of 10 cm.

## 2021-08-25 NOTE — Telephone Encounter (Signed)
I called patient. I discussed her sleep study results and treatment options.  Patient does not want to start an AutoPap.  She will discuss a dental device with her dentist.  She does not think she needs to lose a significant amount of weight but will work on losing around 10 pounds.  She will let us know if her dentist is unable to make a dental device.  She will also let us know if she changes her mind and would like to try an AutoPap.  Patient verbalized understanding of results.  Patient will follow-up with [place] as needed.

## 2021-09-22 NOTE — Telephone Encounter (Signed)
Spoke with staff at [employer]. Unsure why email wasn't received. Staff gave me fax number instead [phone]. Sleep study results faxed. Received a receipt of confirmation. Updated patient.

## 2021-10-15 NOTE — Telephone Encounter (Signed)
Erroneous encounter. Please disregard.

## 2022-02-22 ENCOUNTER — Encounter: Payer: Self-pay | Admitting: Family Medicine

## 2022-02-22 DIAGNOSIS — L821 Other seborrheic keratosis: Secondary | ICD-10-CM | POA: Diagnosis not present

## 2022-02-22 DIAGNOSIS — Z85828 Personal history of other malignant neoplasm of skin: Secondary | ICD-10-CM | POA: Diagnosis not present

## 2022-02-22 DIAGNOSIS — L814 Other melanin hyperpigmentation: Secondary | ICD-10-CM | POA: Diagnosis not present

## 2022-02-22 DIAGNOSIS — D225 Melanocytic nevi of trunk: Secondary | ICD-10-CM | POA: Diagnosis not present

## 2022-02-22 DIAGNOSIS — L570 Actinic keratosis: Secondary | ICD-10-CM | POA: Diagnosis not present

## 2022-02-25 LAB — HM DIABETES EYE EXAM

## 2022-03-01 DIAGNOSIS — I83813 Varicose veins of bilateral lower extremities with pain: Secondary | ICD-10-CM | POA: Diagnosis not present

## 2022-04-22 DIAGNOSIS — Z5181 Encounter for therapeutic drug level monitoring: Secondary | ICD-10-CM | POA: Diagnosis not present

## 2022-05-16 DIAGNOSIS — N390 Urinary tract infection, site not specified: Secondary | ICD-10-CM | POA: Diagnosis not present

## 2022-05-19 DIAGNOSIS — R21 Rash and other nonspecific skin eruption: Secondary | ICD-10-CM | POA: Diagnosis not present

## 2022-05-21 DIAGNOSIS — N390 Urinary tract infection, site not specified: Secondary | ICD-10-CM | POA: Diagnosis not present

## 2022-05-21 DIAGNOSIS — R300 Dysuria: Secondary | ICD-10-CM | POA: Diagnosis not present

## 2022-05-27 DIAGNOSIS — H5319 Other subjective visual disturbances: Secondary | ICD-10-CM | POA: Diagnosis not present

## 2022-05-27 DIAGNOSIS — H26491 Other secondary cataract, right eye: Secondary | ICD-10-CM | POA: Diagnosis not present

## 2022-06-09 ENCOUNTER — Other Ambulatory Visit: Payer: Self-pay | Admitting: Cardiology

## 2022-06-09 NOTE — Telephone Encounter (Signed)
Refill to pharmacy with message patient needs appointment for future refills / 1st attempt

## 2022-07-06 DIAGNOSIS — E7800 Pure hypercholesterolemia, unspecified: Secondary | ICD-10-CM | POA: Diagnosis not present

## 2022-07-06 DIAGNOSIS — J45909 Unspecified asthma, uncomplicated: Secondary | ICD-10-CM | POA: Diagnosis not present

## 2022-07-06 DIAGNOSIS — G4700 Insomnia, unspecified: Secondary | ICD-10-CM | POA: Diagnosis not present

## 2022-07-06 DIAGNOSIS — Z0000 Encounter for general adult medical examination without abnormal findings: Secondary | ICD-10-CM | POA: Diagnosis not present

## 2022-07-26 ENCOUNTER — Other Ambulatory Visit: Payer: Self-pay | Admitting: Internal Medicine

## 2022-07-26 DIAGNOSIS — Z1231 Encounter for screening mammogram for malignant neoplasm of breast: Secondary | ICD-10-CM

## 2022-08-03 ENCOUNTER — Ambulatory Visit
Admission: RE | Admit: 2022-08-03 | Discharge: 2022-08-03 | Disposition: A | Discharge status: Home / Self Care | Payer: BC Managed Care – PPO | Source: Ambulatory Visit | Attending: Internal Medicine | Admitting: Internal Medicine

## 2022-08-03 DIAGNOSIS — Z1231 Encounter for screening mammogram for malignant neoplasm of breast: Secondary | ICD-10-CM

## 2022-11-03 DIAGNOSIS — J45901 Unspecified asthma with (acute) exacerbation: Secondary | ICD-10-CM | POA: Diagnosis not present

## 2022-11-03 DIAGNOSIS — R062 Wheezing: Secondary | ICD-10-CM | POA: Diagnosis not present

## 2022-11-09 DIAGNOSIS — J019 Acute sinusitis, unspecified: Secondary | ICD-10-CM | POA: Diagnosis not present

## 2022-11-09 DIAGNOSIS — B9689 Other specified bacterial agents as the cause of diseases classified elsewhere: Secondary | ICD-10-CM | POA: Diagnosis not present

## 2022-12-14 DIAGNOSIS — E785 Hyperlipidemia, unspecified: Secondary | ICD-10-CM | POA: Diagnosis not present

## 2022-12-14 DIAGNOSIS — J4540 Moderate persistent asthma, uncomplicated: Secondary | ICD-10-CM | POA: Diagnosis not present

## 2022-12-14 DIAGNOSIS — Z1331 Encounter for screening for depression: Secondary | ICD-10-CM | POA: Diagnosis not present

## 2022-12-14 DIAGNOSIS — Z131 Encounter for screening for diabetes mellitus: Secondary | ICD-10-CM | POA: Diagnosis not present

## 2022-12-14 DIAGNOSIS — Z1330 Encounter for screening examination for mental health and behavioral disorders, unspecified: Secondary | ICD-10-CM | POA: Diagnosis not present

## 2022-12-27 DIAGNOSIS — N3000 Acute cystitis without hematuria: Secondary | ICD-10-CM | POA: Diagnosis not present

## 2022-12-28 DIAGNOSIS — L233 Allergic contact dermatitis due to drugs in contact with skin: Secondary | ICD-10-CM | POA: Diagnosis not present

## 2022-12-29 DIAGNOSIS — R21 Rash and other nonspecific skin eruption: Secondary | ICD-10-CM | POA: Diagnosis not present

## 2022-12-31 DIAGNOSIS — N390 Urinary tract infection, site not specified: Secondary | ICD-10-CM | POA: Diagnosis not present

## 2023-03-29 DIAGNOSIS — E785 Hyperlipidemia, unspecified: Secondary | ICD-10-CM | POA: Diagnosis not present

## 2023-03-29 DIAGNOSIS — J4530 Mild persistent asthma, uncomplicated: Secondary | ICD-10-CM | POA: Diagnosis not present

## 2023-03-29 DIAGNOSIS — R399 Unspecified symptoms and signs involving the genitourinary system: Secondary | ICD-10-CM | POA: Diagnosis not present

## 2023-05-04 DIAGNOSIS — R102 Pelvic and perineal pain: Secondary | ICD-10-CM | POA: Diagnosis not present

## 2023-05-04 DIAGNOSIS — D259 Leiomyoma of uterus, unspecified: Secondary | ICD-10-CM | POA: Diagnosis not present

## 2023-06-30 DIAGNOSIS — J4530 Mild persistent asthma, uncomplicated: Secondary | ICD-10-CM | POA: Diagnosis not present

## 2023-06-30 DIAGNOSIS — G4700 Insomnia, unspecified: Secondary | ICD-10-CM | POA: Diagnosis not present

## 2024-03-23 ENCOUNTER — Encounter: Payer: Self-pay | Admitting: Advanced Practice Midwife
